# Patient Record
Sex: Male | Born: 1970 | Race: White | Hispanic: No | Marital: Married | Smoking: Never smoker
Health system: Southern US, Community
[De-identification: ages and names within clinical notes are randomized; demographics above are authoritative.]

## PROBLEM LIST (undated history)

## (undated) DIAGNOSIS — K589 Irritable bowel syndrome without diarrhea: Secondary | ICD-10-CM

## (undated) DIAGNOSIS — K5090 Crohn's disease, unspecified, without complications: Secondary | ICD-10-CM

## (undated) DIAGNOSIS — T8859XA Other complications of anesthesia, initial encounter: Secondary | ICD-10-CM

## (undated) DIAGNOSIS — Z87442 Personal history of urinary calculi: Secondary | ICD-10-CM

## (undated) DIAGNOSIS — T4145XA Adverse effect of unspecified anesthetic, initial encounter: Secondary | ICD-10-CM

## (undated) DIAGNOSIS — C8590 Non-Hodgkin lymphoma, unspecified, unspecified site: Secondary | ICD-10-CM

## (undated) DIAGNOSIS — R109 Unspecified abdominal pain: Secondary | ICD-10-CM

## (undated) HISTORY — PX: EYE SURGERY: SHX253

---
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
## 2001-02-09 HISTORY — PX: EXPLORATORY LAPAROTOMY WITH ABDOMINAL MASS EXCISION: SHX5169

## 2001-06-13 ENCOUNTER — Encounter: Admission: RE | Admit: 2001-06-13 | Discharge: 2001-06-13 | Payer: Self-pay | Admitting: Gastroenterology

## 2001-06-13 ENCOUNTER — Encounter: Payer: Self-pay | Admitting: Gastroenterology

## 2001-06-15 ENCOUNTER — Encounter: Admission: RE | Admit: 2001-06-15 | Discharge: 2001-06-15 | Payer: Self-pay | Admitting: Gastroenterology

## 2001-06-15 ENCOUNTER — Encounter: Payer: Self-pay | Admitting: Gastroenterology

## 2001-06-23 ENCOUNTER — Other Ambulatory Visit: Admission: RE | Admit: 2001-06-23 | Discharge: 2001-06-23 | Payer: Self-pay | Admitting: Gastroenterology

## 2001-06-23 ENCOUNTER — Ambulatory Visit (HOSPITAL_COMMUNITY): Admission: RE | Admit: 2001-06-23 | Discharge: 2001-06-23 | Payer: Self-pay | Admitting: Gastroenterology

## 2001-06-23 ENCOUNTER — Encounter: Payer: Self-pay | Admitting: Gastroenterology

## 2001-07-19 ENCOUNTER — Encounter: Admission: RE | Admit: 2001-07-19 | Discharge: 2001-07-19 | Payer: Self-pay | Admitting: Gastroenterology

## 2001-07-19 ENCOUNTER — Encounter: Payer: Self-pay | Admitting: Gastroenterology

## 2001-07-27 ENCOUNTER — Encounter: Admission: RE | Admit: 2001-07-27 | Discharge: 2001-07-27 | Payer: Self-pay | Admitting: Infectious Diseases

## 2001-08-25 ENCOUNTER — Encounter (INDEPENDENT_AMBULATORY_CARE_PROVIDER_SITE_OTHER): Payer: Self-pay | Admitting: *Deleted

## 2001-08-25 ENCOUNTER — Ambulatory Visit (HOSPITAL_COMMUNITY): Admission: RE | Admit: 2001-08-25 | Discharge: 2001-08-25 | Payer: Self-pay | Admitting: Gastroenterology

## 2001-09-02 ENCOUNTER — Inpatient Hospital Stay (HOSPITAL_COMMUNITY): Admission: RE | Admit: 2001-09-02 | Discharge: 2001-09-06 | Payer: Self-pay | Admitting: Surgery

## 2001-09-02 ENCOUNTER — Encounter (INDEPENDENT_AMBULATORY_CARE_PROVIDER_SITE_OTHER): Payer: Self-pay | Admitting: Specialist

## 2007-07-12 ENCOUNTER — Emergency Department (HOSPITAL_COMMUNITY): Admission: EM | Admit: 2007-07-12 | Discharge: 2007-07-13 | Payer: Self-pay | Admitting: Emergency Medicine

## 2008-04-05 ENCOUNTER — Inpatient Hospital Stay (HOSPITAL_COMMUNITY): Admission: EM | Admit: 2008-04-05 | Discharge: 2008-04-08 | Payer: Self-pay | Admitting: Emergency Medicine

## 2008-06-11 ENCOUNTER — Emergency Department (HOSPITAL_COMMUNITY): Admission: EM | Admit: 2008-06-11 | Discharge: 2008-06-11 | Payer: Self-pay | Admitting: Emergency Medicine

## 2008-07-10 ENCOUNTER — Encounter: Admission: RE | Admit: 2008-07-10 | Discharge: 2008-07-10 | Payer: Self-pay | Admitting: Gastroenterology

## 2009-01-20 ENCOUNTER — Emergency Department (HOSPITAL_COMMUNITY): Admission: EM | Admit: 2009-01-20 | Discharge: 2009-01-20 | Payer: Self-pay | Admitting: Emergency Medicine

## 2010-02-24 IMAGING — CR DG ABDOMEN 2V
3 series · 3 of 3 positions shown · non-contrast
Comparison: 04/07/2008

CLINICAL DATA: Abdominal pain, nausea, vomiting, hematemesis, past
history non-Hodgkins lymphoma and exploratory laparotomy

ABDOMEN - 2 VIEW

[w abdomen upright]
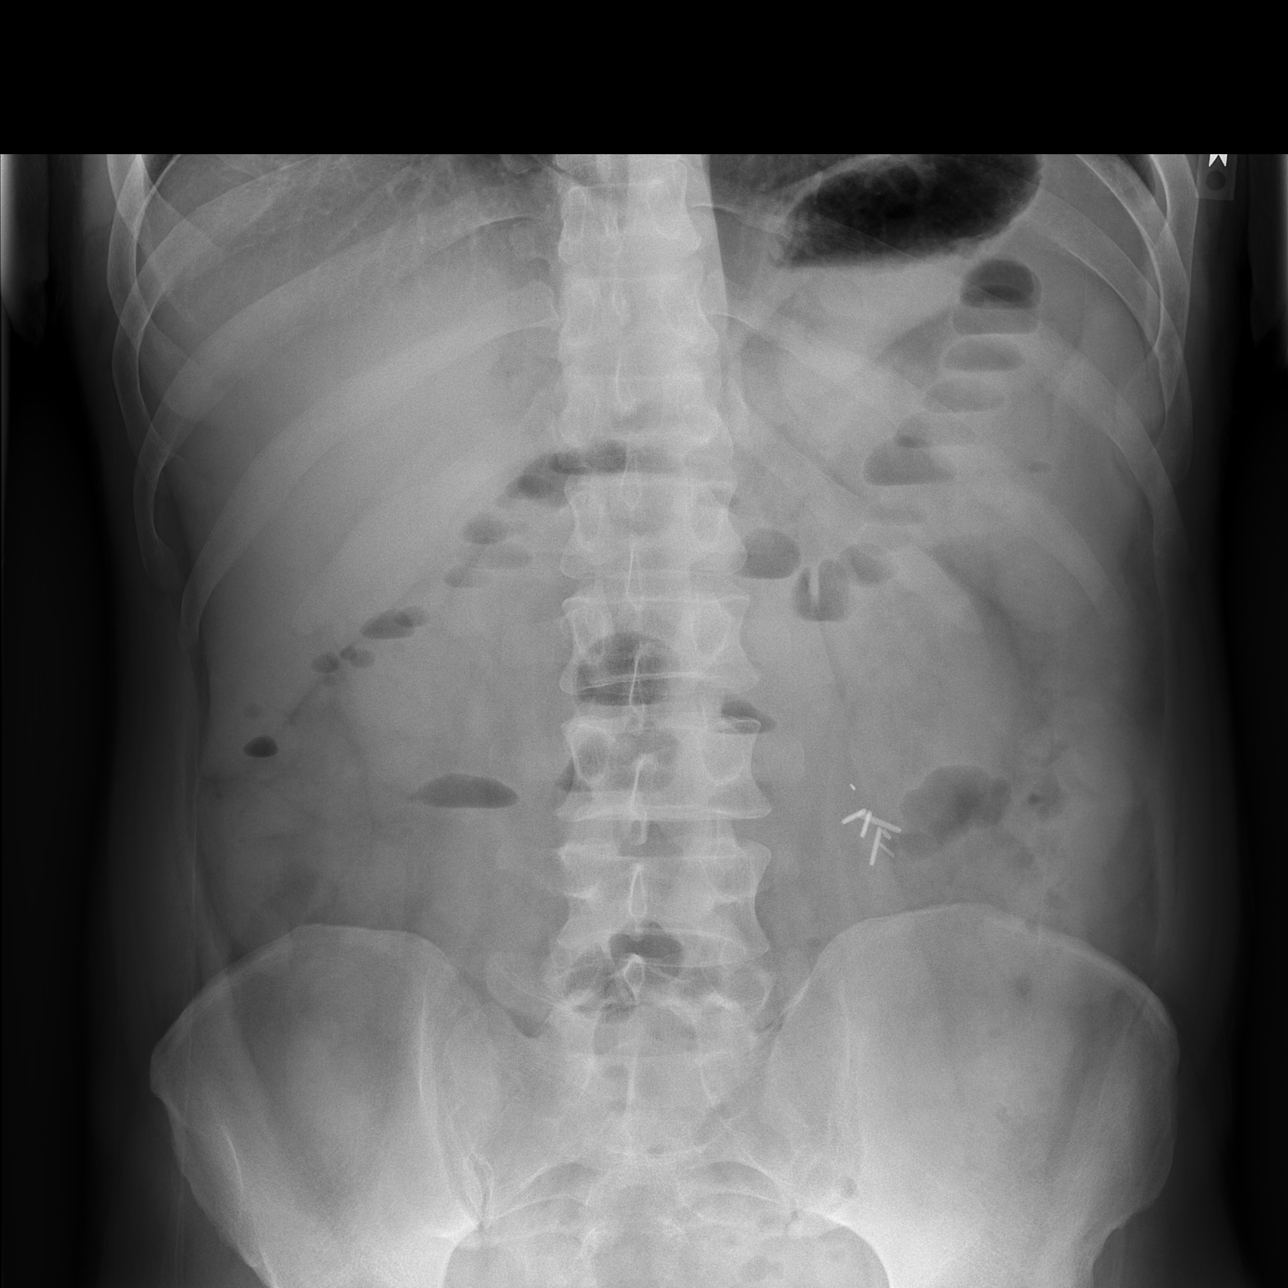

[t abdomen supine (1 of 2)]
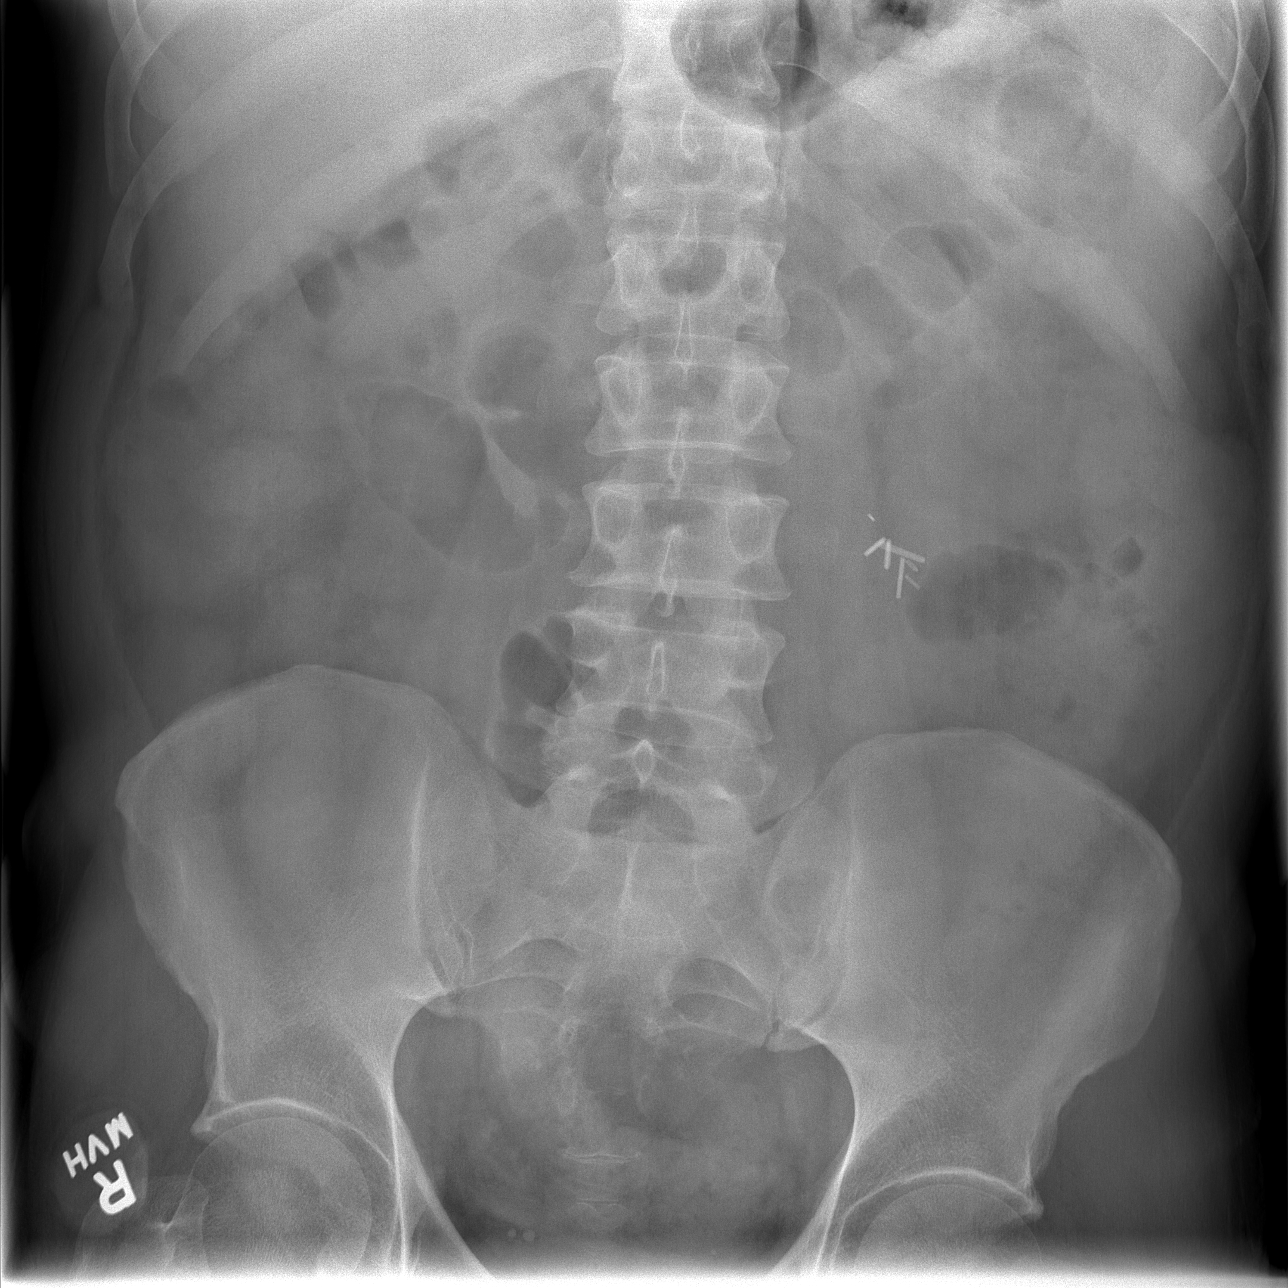

[t abdomen supine (2 of 2)]
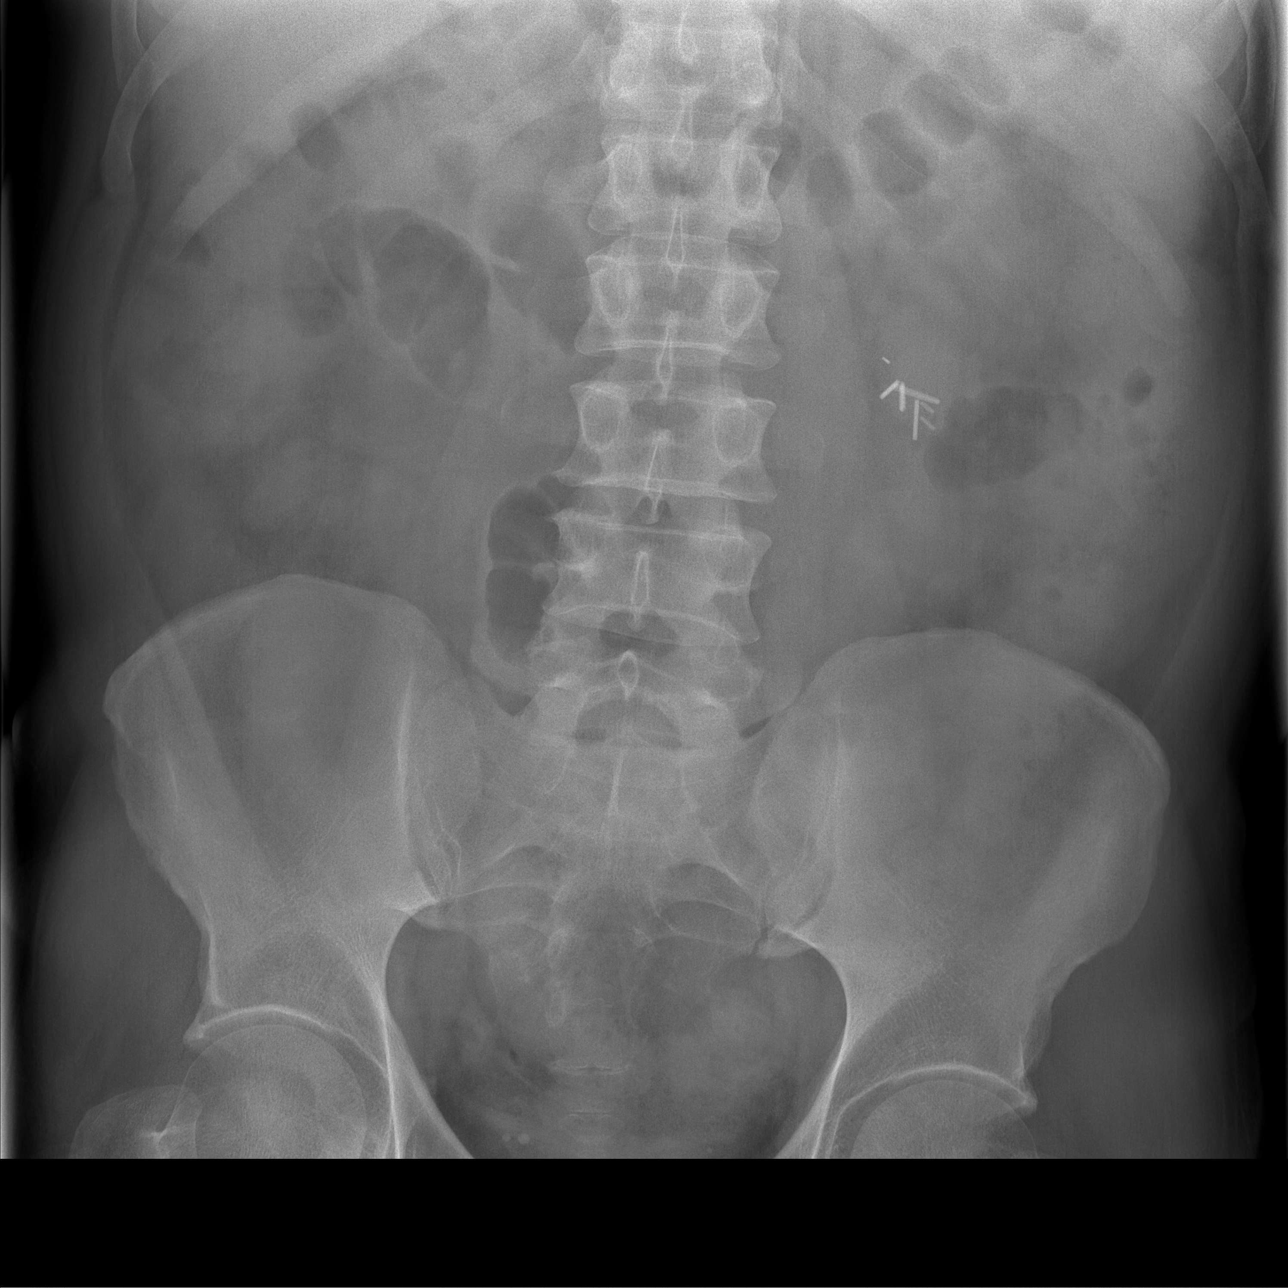

[3 of 3 positions shown; findings below may reference images not displayed]

FINDINGS: Surgical clips left mid abdomen.
Bones unremarkable.
Small right pelvic phleboliths.
No urinary tract calcification.
Small amount gas in transverse colon.
Few small bowel loops in mid abdomen.
Bowel gas pattern is nonspecific without definite evidence of
obstruction or wall thickening.
No free intraperitoneal air.
IMPRESSION: Nonspecific bowel gas pattern.

## 2010-05-13 LAB — COMPREHENSIVE METABOLIC PANEL
Alkaline Phosphatase: 80 U/L (ref 39–117)
BUN: 10 mg/dL (ref 6–23)
Chloride: 103 mEq/L (ref 96–112)
Glucose, Bld: 100 mg/dL — ABNORMAL HIGH (ref 70–99)
Potassium: 4.1 mEq/L (ref 3.5–5.1)
Total Bilirubin: 1 mg/dL (ref 0.3–1.2)
Total Protein: 6.9 g/dL (ref 6.0–8.3)

## 2010-05-13 LAB — DIFFERENTIAL
Basophils Absolute: 0 10*3/uL (ref 0.0–0.1)
Basophils Relative: 0 % (ref 0–1)
Neutro Abs: 10 10*3/uL — ABNORMAL HIGH (ref 1.7–7.7)
Neutrophils Relative %: 90 % — ABNORMAL HIGH (ref 43–77)

## 2010-05-13 LAB — CBC
HCT: 42.6 % (ref 39.0–52.0)
Hemoglobin: 14 g/dL (ref 13.0–17.0)
MCV: 85 fL (ref 78.0–100.0)
RDW: 14 % (ref 11.5–15.5)

## 2010-05-20 LAB — COMPREHENSIVE METABOLIC PANEL
ALT: 14 U/L (ref 0–53)
AST: 22 U/L (ref 0–37)
CO2: 29 mEq/L (ref 19–32)
Chloride: 105 mEq/L (ref 96–112)
GFR calc Af Amer: >60 mL/min (ref >60)
GFR calc non Af Amer: >60 mL/min (ref >60)
Sodium: 140 mEq/L (ref 135–145)
Total Bilirubin: 0.8 mg/dL (ref 0.3–1.2)

## 2010-05-20 LAB — LIPASE, BLOOD: Lipase: 28 U/L (ref 11–59)

## 2010-05-20 LAB — DIFFERENTIAL
Basophils Absolute: 0 10*3/uL (ref 0.0–0.1)
Eosinophils Absolute: 0.2 10*3/uL (ref 0.0–0.7)
Eosinophils Relative: 2 % (ref 0–5)
Lymphs Abs: 0.9 10*3/uL (ref 0.7–4.0)

## 2010-05-20 LAB — CBC
RBC: 5.28 MIL/uL (ref 4.22–5.81)
WBC: 8.4 10*3/uL (ref 4.0–10.5)

## 2010-05-27 LAB — COMPREHENSIVE METABOLIC PANEL
Albumin: 4.3 g/dL (ref 3.5–5.2)
Alkaline Phosphatase: 92 U/L (ref 39–117)
BUN: 9 mg/dL (ref 6–23)
Calcium: 9.7 mg/dL (ref 8.4–10.5)
Creatinine, Ser: 1.19 mg/dL (ref 0.4–1.5)
Potassium: 4 mEq/L (ref 3.5–5.1)
Total Protein: 7.5 g/dL (ref 6.0–8.3)

## 2010-05-27 LAB — DIFFERENTIAL
Basophils Absolute: 0 10*3/uL (ref 0.0–0.1)
Basophils Relative: 0 % (ref 0–1)
Lymphocytes Relative: 13 % (ref 12–46)
Lymphocytes Relative: 8 % — ABNORMAL LOW (ref 12–46)
Lymphs Abs: 1.1 10*3/uL (ref 0.7–4.0)
Monocytes Absolute: 0.4 10*3/uL (ref 0.1–1.0)
Monocytes Absolute: 0.5 10*3/uL (ref 0.1–1.0)
Monocytes Relative: 10 % (ref 3–12)
Monocytes Relative: 3 % (ref 3–12)
Neutro Abs: 12.4 10*3/uL — ABNORMAL HIGH (ref 1.7–7.7)
Neutro Abs: 3.7 10*3/uL (ref 1.7–7.7)
Neutrophils Relative %: 75 % (ref 43–77)

## 2010-05-27 LAB — CBC
HCT: 50.7 % (ref 39.0–52.0)
Hemoglobin: 12.3 g/dL — ABNORMAL LOW (ref 13.0–17.0)
Platelets: 190 10*3/uL (ref 150–400)
RBC: 4.35 MIL/uL (ref 4.22–5.81)
RDW: 13.7 % (ref 11.5–15.5)
RDW: 14.9 % (ref 11.5–15.5)

## 2010-05-27 LAB — BASIC METABOLIC PANEL
Calcium: 8.5 mg/dL (ref 8.4–10.5)
Creatinine, Ser: 1.17 mg/dL (ref 0.4–1.5)
GFR calc Af Amer: >60 mL/min (ref >60)
GFR calc non Af Amer: >60 mL/min (ref >60)
Sodium: 139 mEq/L (ref 135–145)

## 2010-05-27 LAB — URINALYSIS, ROUTINE W REFLEX MICROSCOPIC
Glucose, UA: NEGATIVE mg/dL
Hgb urine dipstick: NEGATIVE
Specific Gravity, Urine: 1.029 (ref 1.005–1.030)
pH: 8 (ref 5.0–8.0)

## 2010-05-27 LAB — URINE MICROSCOPIC-ADD ON

## 2010-06-24 NOTE — H&P (Signed)
NAMENISHAN, OVENS                 ACCOUNT NO.:  [id number]   MEDICAL RECORD NO.:  [id number]          PATIENT TYPE:  INP   LOCATION:  [place]                         FACILITY:  [facility]   PHYSICIAN:  Isabel Caprice. Hassell Done, MD  DATE OF BIRTH:  12-14-1970   DATE OF ADMISSION:  04/05/2008  DATE OF DISCHARGE:                              HISTORY & PHYSICAL   CHIEF COMPLAINT:  Partial small bowel obstruction.   HISTORY:  This is a 40 year old white male who began having cramping  abdominal pain after eating chicken tenders on April 04, 2008.  He  was seen in the emergency department, evaluated and CT scan was obtained  which showed some dilated proximal loops of small bowel with transition  zone thought possibly partial small bowel obstruction.  His history  significant.  He had had a non-Hodgkin's lymphoma on exploratory  laparotomy by Dr. Margot Chimes in the past.  He has also seen Dr. Mikki Santee Buccini  in the past for intermittent abdominal discomfort, possibly partial  small bowel obstruction.  He previously had an attack a few weeks ago  for which he was seen in emergency room.  This time the pain lasted  longer.   Current problem has been abdominal pain associated with nausea, vomiting  and some distention and he has had some feeling of borborygmus.   ALLERGIES:  PENICILLIN WHICH CAUSES HIVES AND LATEX WHICH PRODUCES A  RASH.   MEDICATIONS:  Include oral Tylenol for sinus allergies and Pepto-Bismol.   SOCIAL HISTORY:  He is a nonsmoker, nondrinker.  No drug abuse.  He is  married.   PHYSICAL:  Well-developed, well-nourished white male with an NG tube in  place.  HEAD:  Normocephalic.  Sclerae nonicteric.  Pupils equal, round and  reactive to light.  Nose and throat exam unremarkable.  NECK:  Supple.  CHEST:  Clear.  ABDOMEN:  Has some tenderness and some distention.  No flatus or BMs.  EXTREMITY EXAM:  Full range of motion.  SKIN:  No rashes noted.  PSYCH:  Appropriate in terms of  questions and mood.   X-RAYS:  Small air-fluid levels.   LABORATORY:  Includes a hemoglobin of 16.8.  White count 14,100.  Normal  electrolytes including a normal potassium of 4.   IMPRESSION:  1. Partial small bowel obstruction.  2. Questionable circumferential mural thickening in the terminal      ileum, suggesting possible inflammatory bowel disease versus      terminal ileitis.   PLAN:  Admit observation with nasogastric tube suction.      Isabel Caprice Hassell Done, MD  Electronically Signed     MBM/MEDQ  D:  04/05/2008  T:  04/05/2008  Job:  [id number]

## 2010-06-27 NOTE — Discharge Summary (Signed)
NAMETARAY, NORMOYLE                 ACCOUNT NO.:  [id number]   MEDICAL RECORD NO.:  [id number]          PATIENT TYPE:  INP   LOCATION:  1342                         FACILITY:  [facility]   PHYSICIAN:  Isabel Caprice. Hassell Done, MD  DATE OF BIRTH:  02-Mar-1970   DATE OF ADMISSION:  04/04/2008  DATE OF DISCHARGE:  04/08/2008                               DISCHARGE SUMMARY   HISTORY:  Eric Winters presented to the ED with symptoms of a partial  small-bowel obstruction.  He has had a history of non-Hodgkins lymphoma  treated with laparotomy by Dr. Margot Chimes.  He came in and had an NG tube  placed and then began passing some flatus.  We watched his x-rays get  better, and we then started him on some liquids.  He advanced over that  and was ready to go home on hospital day #4 with what appeared to be  resolved partial small-bowel obstruction, and he was scheduled to follow  up with Dr. Hassell Done in the office.  Vital signs remained stable.   IMPRESSION:  Resolution of partial small-bowel obstruction.      Isabel Caprice Hassell Done, MD  Electronically Signed     MBM/MEDQ  D:  05/21/2008  T:  05/21/2008  Job:  [id number]

## 2010-06-27 NOTE — Discharge Summary (Signed)
   NAME:  Eric Winters, STETTNER                       ACCOUNT NO.:  [id number]   MEDICAL RECORD NO.:  [id number]                   PATIENT TYPE:  INP   LOCATION:  6294                                 FACILITY:  [facility]   PHYSICIAN:  Haywood Lasso, M.D.           DATE OF BIRTH:  Feb 18, 1970   DATE OF ADMISSION:  09/02/2001  DATE OF DISCHARGE:  09/04/2001                                 DISCHARGE SUMMARY   OFFICE MEDICAL RECORD NUMBER  [id number]   FINAL DIAGNOSIS:  1. Low-grade follicular non-Hodgkin's lymphoma (mixed), grade 2.  2. Probable mild Crohn's disease of the terminal ileum.   HISTORY OF PRESENT ILLNESS:  The patient is a 40 year old gentleman who has  been evaluated for some abdominal symptoms, was found to possibly have some  Crohn's but also marked adenopathy in the abdomen.  There was no more  superficial adenopathy that could be biopsied, and a percutaneous biopsy had  been nondiagnostic.   HOSPITAL COURSE:  The patient was admitted on September 02, 2001, and taken to  the operating room where a laparotomy was done.  Markedly enlarged  mesenteric nodes were noted, as well as what appeared to be possibly some  abnormality of the omentum.  A small omental biopsy was taken as well as a  fairly large piece of a mesenteric node.   The patient tolerated the procedure fairly well.  Postoperatively he had a  fair amount of pain, but this was managed with Toradol plus some morphine.  He improved over the next couple of days and, by the fourth postoperative  day, was passing gas, no nausea, tolerating diet, and we felt was able to be  discharged.  He was sent home in satisfactory condition.  Tylox for pain.  Follow-up in my office in approximately five days, and limited activities.                                               Haywood Lasso, M.D.    CJS/MEDQ  D:  09/15/2001  T:  09/17/2001  Job:  [id number]   cc:   Cleotis Nipper, M.D.

## 2010-06-27 NOTE — Procedures (Signed)
[place]  Patient:    Eric Winters, Eric Winters Visit Number: [id number] MRN: [id number]          Service Type: END Location: ENDO Attending Physician:  Ernie Avena Dictated by:   Cleotis Nipper, M.D. Proc. Date: 08/25/01 Admit Date:  08/25/2001 Discharge Date: 08/25/2001   CC:         Dr. Elliot Gurney   Procedure Report  COLONOSCOPY WITH BIOPSIES  INDICATION:  This is a 40 year old gentleman who, for the past several months, has had nonspecific abdominal pain (actually getting better at the present time) with a CT scan showing mesenteric and retroperitoneal lymphadenopathy suspicious for lymphoma.  On the other hand, a fine-needle biopsy of that material was equivocal for lymphoma and showed some characteristics which would not be typical for lymphoma in this age group.  Accordingly, there has been quite a bit of diagnostic uncertainty.  The patient is scheduled for exploratory surgery and definitive biopsy about two weeks from now but before doing so, I wanted to rule out Crohns ileitis since there was a question of some terminal ileal thickening on the patients CT scan.  FINDINGS:  Essentially normal exam/slight erythema and stiffness of the terminal ileum without any typical findings of Crohns.  PROCEDURE:  The nature and purpose of the procedure have been discussed with the patient and provided with consent.  Sedation was fentanyl 100 mcg and Versed 10 mg IV without arrhythmias or desaturation.  Digital exam of the prostate was unremarkable.  The Olympus videocolonoscope was advanced without much difficulty to the cecum and then for a short distance into the terminal ileum.  The ileocecal valve orifice was slightly tight, and upon entering the terminal ileum, it felt a little bit "stiff" but this is an equivocal call. There was some erythema of the mucosa and some slight nodularity consistent with lymphoid hyperplasia but I did  not see any impressive nodules or mass effect, nor did I see any ulcerations, cobblestoning, or obvious changes of Crohns disease.  Several biopsies were obtained prior to removal of the scope.  The colon was normal, without evidence of polyps, cancer, colitis, vascular malformations, or diverticulosis.  The quality of the prep was very good and it was felt that all areas were well seen.  Retroflexion was not performed in the rectum due to a small rectal ampulla.  The patient tolerated the procedure well and there were no apparent complications.  IMPRESSION:  Slight abnormalities in the terminal ileum, nonspecific in character and not really suggestive of Crohns disease, but neither are they overtly suspicious of lymphoma.  PLAN: 1. Await pathology on todays biopsies. 2. For now, we will anticipate proceeding with exploratory surgery as    scheduled for approximately one week from now.Dictated by:   Cleotis Nipper, M.D. Attending Physician:  Ernie Avena DD:  08/25/01 TD:  08/30/01 Job: [id number]

## 2010-06-27 NOTE — Op Note (Signed)
[facility]  Patient:    Eric Winters, Eric Winters Visit Number: [id number] MRN: [id number]          Service Type: SUR Location: 3W 6010 01 Attending Physician:  Oletha Cruel Dictated by:   Haywood Lasso, M.D. Proc. Date: 09/02/01 Admit Date:  09/02/2001 Discharge Date: 09/02/2001   CC:         Cleotis Nipper, M.D.   Operative Report  ACCOUNT NO. [id number]. CCS [id number].  PREOPERATIVE DIAGNOSES: 1. Possible abdominal lymphoma. 2. Possible Crohns.  POSTOPERATIVE DIAGNOSES: 1. Abdominal adenopathy, probable lymphoma. 2. Probable Crohns.  PROCEDURE:  Exploratory laparotomy with excisional biopsy of mesenteric lymph node and omental biopsy.  SURGEON:  Haywood Lasso, M.D.  ASSISTANT:  Abbott Pao. March Rummage, M.D.  ANESTHESIA:  General endotracheal.  CLINICAL HISTORY:  Mr. Tubby is a 40 year old gentleman who has had an evaluation showing some significant adenopathy in his abdomen, and a radiographic biopsy showed atypical lymphoid cells suspicious but not diagnostic of lymphoma.  He also had an area in the small bowel that looked like Crohns disease and other workups to see if there was any infectious cause for his lymphadenopathy.  None was found, and he was recommended for abdominal exploration and biopsy of some of these retroperitoneal nodes.  DESCRIPTION OF PROCEDURE:  The patient was seen in the holding area, and all questions were reviewed again with him and answered.  He was then taken to the operating room and after satisfactory general endotracheal anesthesia, the abdomen was prepped and draped.  I made a midline incision from just above the umbilicus to below it and I was able to enter the abdomen, but he is fairly muscular and this was an inadequate-length incision.  I could feel some significant retroperitoneal adenopathy.  Incision was extended to an appropriate length and a self-retaining retractor placed.  The liver  and spleen to palpation were normal.  The small bowel looked normal until the terminal ileum, where it looked somewhat thickened and there was creeping fat suggestive of Crohns disease.  There was no evidence of active inflammatory process there and the distal mesentery throughout the small bowel was fairly normal, but the proximal mesentery was diffusely filled with enlarged nodes, most of which were matted.  I was able to pull up a little bit and find a very hard node in the proximal small bowel mesentery that I thought we could get out fairly readily.  I opened the peritoneum just over this node and using a combination of sharp and blunt dissection and some small clips, I was able to excise this.  It was felt fairly necrotic, and the mesenteric blood vessels came essentially directly through this node and I had to clip those.  The node was removed and a small portion sent for cultures and the larger piece for pathology.  I felt I had adequate tissue as I had about a 3 cm section of node.  The area was checked for hemostasis.  I left a little Surgicel in in case there was any oozing and closed the mesentery over the biopsy site.  The omentum on the inferior surface was studded with small red areas that were reminiscent of splenosis, although possibly some lymphomatous change here also.  I took a couple of the smaller areas out and then also a little wedge of omentum that contained one of those areas plus some normal omentum and sent that for pathology.  The omentum was tied with 2-0 silk where  we took these off.  Everything appeared to be dry.  There was no collection of blood, no evidence of bleeding at the biopsy site of the lymph node or any of the omentum.  The abdomen was closed with a running #1 Novofil.  The subcu was irrigated and closed with staples.  The patient tolerated the procedure well.  There were no operative complications, and all counts were correct. Dictated by:    Haywood Lasso, M.D. Attending Physician:  Oletha Cruel DD:  09/02/01 TD:  09/06/01 Job: [id number]

## 2010-07-22 ENCOUNTER — Other Ambulatory Visit: Payer: Self-pay | Admitting: Gastroenterology

## 2010-07-25 ENCOUNTER — Ambulatory Visit
Admission: RE | Admit: 2010-07-25 | Discharge: 2010-07-25 | Disposition: A | Discharge status: Home / Self Care | Payer: BC Managed Care – PPO | Source: Ambulatory Visit | Attending: Gastroenterology | Admitting: Gastroenterology

## 2010-08-23 ENCOUNTER — Emergency Department (HOSPITAL_COMMUNITY)
Admission: EM | Admit: 2010-08-23 | Discharge: 2010-08-23 | Disposition: A | Discharge status: Home / Self Care | Payer: BC Managed Care – PPO | Attending: Emergency Medicine | Admitting: Emergency Medicine

## 2010-08-23 ENCOUNTER — Emergency Department (HOSPITAL_COMMUNITY): Payer: BC Managed Care – PPO

## 2010-08-23 DIAGNOSIS — R1012 Left upper quadrant pain: Secondary | ICD-10-CM | POA: Insufficient documentation

## 2010-08-23 DIAGNOSIS — R112 Nausea with vomiting, unspecified: Secondary | ICD-10-CM | POA: Insufficient documentation

## 2010-08-23 DIAGNOSIS — R319 Hematuria, unspecified: Secondary | ICD-10-CM | POA: Insufficient documentation

## 2010-08-23 DIAGNOSIS — C8589 Other specified types of non-Hodgkin lymphoma, extranodal and solid organ sites: Secondary | ICD-10-CM | POA: Insufficient documentation

## 2010-08-23 DIAGNOSIS — R109 Unspecified abdominal pain: Secondary | ICD-10-CM | POA: Insufficient documentation

## 2010-08-23 LAB — CBC
Hemoglobin: 14.3 g/dL (ref 13.0–17.0)
MCH: 27 pg (ref 26.0–34.0)
MCHC: 32.3 g/dL (ref 30.0–36.0)
MCV: 83.7 fL (ref 78.0–100.0)

## 2010-08-23 LAB — URINALYSIS, ROUTINE W REFLEX MICROSCOPIC
Bilirubin Urine: NEGATIVE
Glucose, UA: NEGATIVE mg/dL
Hgb urine dipstick: NEGATIVE
Protein, ur: 100 mg/dL — AB

## 2010-08-23 LAB — COMPREHENSIVE METABOLIC PANEL
Alkaline Phosphatase: 102 U/L (ref 39–117)
BUN: 11 mg/dL (ref 6–23)
CO2: 25 mEq/L (ref 19–32)
Chloride: 101 mEq/L (ref 96–112)
Creatinine, Ser: 1.11 mg/dL (ref 0.50–1.35)
GFR calc non Af Amer: >60 mL/min (ref >60)
Glucose, Bld: 106 mg/dL — ABNORMAL HIGH (ref 70–99)
Potassium: 3.6 mEq/L (ref 3.5–5.1)
Total Bilirubin: 0.6 mg/dL (ref 0.3–1.2)

## 2010-08-23 LAB — DIFFERENTIAL
Basophils Absolute: 0 10*3/uL (ref 0.0–0.1)
Lymphocytes Relative: 6 % — ABNORMAL LOW (ref 12–46)
Monocytes Absolute: 0.7 10*3/uL (ref 0.1–1.0)
Neutro Abs: 9.1 10*3/uL — ABNORMAL HIGH (ref 1.7–7.7)

## 2010-08-23 LAB — LIPASE, BLOOD: Lipase: 34 U/L (ref 11–59)

## 2010-11-06 LAB — DIFFERENTIAL
Basophils Absolute: 0
Eosinophils Relative: 0
Lymphocytes Relative: 6 — ABNORMAL LOW
Monocytes Absolute: 0.4
Monocytes Relative: 4
Neutro Abs: 9.1 — ABNORMAL HIGH

## 2010-11-06 LAB — URINALYSIS, ROUTINE W REFLEX MICROSCOPIC
Bilirubin Urine: NEGATIVE
Glucose, UA: NEGATIVE
Hgb urine dipstick: NEGATIVE
Specific Gravity, Urine: 1.005
Urobilinogen, UA: 0.2

## 2010-11-06 LAB — CBC
Platelets: 183
RDW: 14.8
WBC: 10.1

## 2010-11-06 LAB — COMPREHENSIVE METABOLIC PANEL
AST: 24
Albumin: 3.7
Alkaline Phosphatase: 91
Chloride: 103
Creatinine, Ser: 1.23
GFR calc Af Amer: >60
Potassium: 4.1
Total Bilirubin: 1
Total Protein: 6.7

## 2011-08-15 ENCOUNTER — Encounter (HOSPITAL_BASED_OUTPATIENT_CLINIC_OR_DEPARTMENT_OTHER): Payer: Self-pay | Admitting: *Deleted

## 2011-08-15 ENCOUNTER — Emergency Department (HOSPITAL_BASED_OUTPATIENT_CLINIC_OR_DEPARTMENT_OTHER)
Admission: EM | Admit: 2011-08-15 | Discharge: 2011-08-15 | Disposition: A | Discharge status: Home / Self Care | Payer: BC Managed Care – PPO | Attending: Emergency Medicine | Admitting: Emergency Medicine

## 2011-08-15 DIAGNOSIS — M109 Gout, unspecified: Secondary | ICD-10-CM

## 2011-08-15 DIAGNOSIS — K5090 Crohn's disease, unspecified, without complications: Secondary | ICD-10-CM | POA: Insufficient documentation

## 2011-08-15 DIAGNOSIS — C8589 Other specified types of non-Hodgkin lymphoma, extranodal and solid organ sites: Secondary | ICD-10-CM | POA: Insufficient documentation

## 2011-08-15 HISTORY — DX: Irritable bowel syndrome without diarrhea: K58.9

## 2011-08-15 HISTORY — DX: Crohn's disease, unspecified, without complications: K50.90

## 2011-08-15 HISTORY — DX: Non-Hodgkin lymphoma, unspecified, unspecified site: C85.90

## 2011-08-15 HISTORY — DX: Irritable bowel syndrome, unspecified: K58.9

## 2011-08-15 MED ORDER — INDOMETHACIN 25 MG PO CAPS: 25.0000 mg | ORAL_CAPSULE | Freq: Three times a day (TID) | ORAL | Status: AC | PRN

## 2011-08-15 MED ORDER — OXYCODONE-ACETAMINOPHEN 5-325 MG PO TABS
1.0000 | ORAL_TABLET | Freq: Once | ORAL | Status: AC
  Administered 2011-08-15: 1 via ORAL
  Filled 2011-08-15: qty 1

## 2011-08-15 MED ORDER — OXYCODONE-ACETAMINOPHEN 5-325 MG PO TABS: 1.0000 | ORAL_TABLET | Freq: Four times a day (QID) | ORAL | Status: AC | PRN

## 2011-08-15 MED ORDER — COLCHICINE 0.6 MG PO TABS: 0.6000 mg | ORAL_TABLET | Freq: Every day | ORAL | Status: DC

## 2011-08-15 NOTE — ED Provider Notes (Signed)
History   This chart was scribed for [name], MD by Kathreen Cornfield. The patient was seen in room MH06/MH06 and the patient's care was started at 10:35 PM      CSN: [id number]  Arrival date & time 08/15/11  2152   First MD Initiated Contact with Patient 08/15/11 2207      Chief Complaint  Patient presents with  . Foot Pain    (Consider location/radiation/quality/duration/timing/severity/associated sxs/prior treatment) Patient is a 41 y.o. male presenting with lower extremity pain. The history is provided by the patient. No language interpreter was used.  Foot Pain This is a new problem. The current episode started 6 to 12 hours ago. The problem occurs constantly. The problem has not changed since onset.Pertinent negatives include no chest pain, no abdominal pain and no headaches. The symptoms are aggravated by walking and stress. Nothing relieves the symptoms. He has tried nothing for the symptoms. The treatment provided no relief.    Eric Winters is a 41 y.o. male who presents to the Emergency Department complaining of moderate, episodic foot pain located at the right foot onset today with associated symptoms of swelling. The pt informs the EDP that he went to put his shoe on today, prepairing to do yard work, where he then noticed his foot was swollen. Pt reports he has recently eaten hamburgers, and hot dogs at a cookout. The pt informs the EDP that he has been sitting at a desk more than in the past, for the past month. Pt has a hx of crohns disease, non hodgkin's lymphoma, allergy to penicillin.   Pt denies drinking alcohol, changes in medications.       Past Medical History  Diagnosis Date  . Non Hodgkin's lymphoma   . IBS (irritable bowel syndrome)   . Crohn disease     Past Surgical History  Procedure Date  . Eye surgery   . Abdominal surgery     History reviewed. No pertinent family history.  History  Substance Use Topics  . Smoking status: Never Smoker     . Smokeless tobacco: Not on file  . Alcohol Use: Yes      Review of Systems  Cardiovascular: Negative for chest pain.  Gastrointestinal: Negative for abdominal pain.  Neurological: Negative for headaches.  All other systems reviewed and are negative.    10 Systems reviewed and all are negative for acute change except as noted in the HPI.    Allergies  Review of patient's allergies indicates not on file.  Home Medications  No current outpatient prescriptions on file.  BP 123/77  Pulse 82  Temp 98.4 F (36.9 C) (Oral)  Resp 20  Ht 5' 10.5" (1.791 m)  Wt 220 lb (99.791 kg)  BMI 31.12 kg/m2  SpO2 96%  Physical Exam  Nursing note and vitals reviewed. Constitutional: He is oriented to person, place, and time. He appears well-developed and well-nourished.  HENT:  Head: Atraumatic.  Right Ear: External ear normal.  Left Ear: External ear normal.  Nose: Nose normal.  Neck: Normal range of motion.  Musculoskeletal:       Right first MTP warmth, redness, and tenderness.   Neurological: He is alert and oriented to person, place, and time.  Skin: Skin is warm and dry.  Psychiatric: He has a normal mood and affect. His behavior is normal.    ED Course  Procedures (including critical care time)  DIAGNOSTIC STUDIES: Oxygen Saturation is 96% on room air, adequate by my interpretation.  COORDINATION OF CARE:    10:36PM- EDP at bedside discusses treatment plan concerning gout, Cotranzine, pain management, follow up with PCP.   Labs Reviewed - No data to display No results found.   1. Gout       MDM   Patient with evidence of gout in his right great toe. There's no sign of cellulitis or concern for septic joint at this time. Patient was given supportive care and will follow up with his PCP      I personally performed the services described in this documentation, which was scribed in my presence.  The recorded information has been reviewed and  considered.   [name], MD 08/15/11 2246

## 2011-08-15 NOTE — ED Notes (Signed)
rx x 3 given for percocet, indocin, and colchicine- pt has a ride

## 2011-08-15 NOTE — ED Notes (Signed)
Pt states that his right foot has been red and swollen since this a.m. Thinks he may have been bitten by something.

## 2012-02-18 ENCOUNTER — Encounter (HOSPITAL_BASED_OUTPATIENT_CLINIC_OR_DEPARTMENT_OTHER): Payer: Self-pay | Admitting: *Deleted

## 2012-02-18 DIAGNOSIS — Z79899 Other long term (current) drug therapy: Secondary | ICD-10-CM | POA: Insufficient documentation

## 2012-02-18 DIAGNOSIS — R109 Unspecified abdominal pain: Secondary | ICD-10-CM | POA: Insufficient documentation

## 2012-02-18 DIAGNOSIS — C8589 Other specified types of non-Hodgkin lymphoma, extranodal and solid organ sites: Secondary | ICD-10-CM | POA: Insufficient documentation

## 2012-02-18 DIAGNOSIS — Z8719 Personal history of other diseases of the digestive system: Secondary | ICD-10-CM | POA: Insufficient documentation

## 2012-02-18 NOTE — ED Notes (Signed)
Pt c/o abd pain with n/v x 8 hrs

## 2012-02-19 ENCOUNTER — Emergency Department (HOSPITAL_COMMUNITY)
Admission: EM | Admit: 2012-02-19 | Discharge: 2012-02-19 | Disposition: A | Discharge status: Home / Self Care | Payer: BC Managed Care – PPO | Source: Home / Self Care

## 2012-02-19 ENCOUNTER — Inpatient Hospital Stay (HOSPITAL_COMMUNITY)
Admission: EM | Admit: 2012-02-19 | Discharge: 2012-02-22 | DRG: 180 | Disposition: A | Discharge status: Home / Self Care | Payer: BC Managed Care – PPO | Attending: General Surgery | Admitting: General Surgery

## 2012-02-19 ENCOUNTER — Emergency Department (HOSPITAL_COMMUNITY): Payer: BC Managed Care – PPO

## 2012-02-19 ENCOUNTER — Emergency Department (HOSPITAL_BASED_OUTPATIENT_CLINIC_OR_DEPARTMENT_OTHER): Payer: BC Managed Care – PPO

## 2012-02-19 ENCOUNTER — Encounter (HOSPITAL_COMMUNITY): Payer: Self-pay | Admitting: Emergency Medicine

## 2012-02-19 ENCOUNTER — Emergency Department (HOSPITAL_BASED_OUTPATIENT_CLINIC_OR_DEPARTMENT_OTHER)
Admission: EM | Admit: 2012-02-19 | Discharge: 2012-02-19 | Disposition: A | Discharge status: Home / Self Care | Payer: BC Managed Care – PPO | Source: Home / Self Care | Attending: Emergency Medicine | Admitting: Emergency Medicine

## 2012-02-19 DIAGNOSIS — K56609 Unspecified intestinal obstruction, unspecified as to partial versus complete obstruction: Principal | ICD-10-CM | POA: Diagnosis present

## 2012-02-19 DIAGNOSIS — C8583 Other specified types of non-Hodgkin lymphoma, intra-abdominal lymph nodes: Secondary | ICD-10-CM | POA: Diagnosis present

## 2012-02-19 DIAGNOSIS — Z79899 Other long term (current) drug therapy: Secondary | ICD-10-CM

## 2012-02-19 DIAGNOSIS — R109 Unspecified abdominal pain: Secondary | ICD-10-CM

## 2012-02-19 DIAGNOSIS — Z8719 Personal history of other diseases of the digestive system: Secondary | ICD-10-CM

## 2012-02-19 DIAGNOSIS — C8593 Non-Hodgkin lymphoma, unspecified, intra-abdominal lymph nodes: Secondary | ICD-10-CM | POA: Diagnosis present

## 2012-02-19 DIAGNOSIS — Z91040 Latex allergy status: Secondary | ICD-10-CM

## 2012-02-19 DIAGNOSIS — Z880 Allergy status to penicillin: Secondary | ICD-10-CM

## 2012-02-19 LAB — COMPREHENSIVE METABOLIC PANEL
ALT: 12 U/L (ref 0–53)
BUN: 12 mg/dL (ref 6–23)
CO2: 21 mEq/L (ref 19–32)
Calcium: 10 mg/dL (ref 8.4–10.5)
GFR calc Af Amer: >90 mL/min (ref >90)
GFR calc non Af Amer: >90 mL/min (ref >90)
Glucose, Bld: 118 mg/dL — ABNORMAL HIGH (ref 70–99)
Sodium: 138 mEq/L (ref 135–145)
Total Protein: 7.4 g/dL (ref 6.0–8.3)

## 2012-02-19 LAB — URINALYSIS, ROUTINE W REFLEX MICROSCOPIC
Bilirubin Urine: NEGATIVE
Ketones, ur: >80 mg/dL — AB
Leukocytes, UA: NEGATIVE
Nitrite: NEGATIVE
Specific Gravity, Urine: 1.028 (ref 1.005–1.030)
Urobilinogen, UA: 0.2 mg/dL (ref 0.0–1.0)

## 2012-02-19 LAB — CBC WITH DIFFERENTIAL/PLATELET
Eosinophils Absolute: 0 10*3/uL (ref 0.0–0.7)
Eosinophils Relative: 0 % (ref 0–5)
HCT: 47.3 % (ref 39.0–52.0)
Hemoglobin: 15.5 g/dL (ref 13.0–17.0)
Lymphocytes Relative: 4 % — ABNORMAL LOW (ref 12–46)
Lymphs Abs: 0.5 10*3/uL — ABNORMAL LOW (ref 0.7–4.0)
MCH: 27 pg (ref 26.0–34.0)
MCV: 82.3 fL (ref 78.0–100.0)
Monocytes Absolute: 0.7 10*3/uL (ref 0.1–1.0)
Monocytes Relative: 6 % (ref 3–12)
Platelets: 156 10*3/uL (ref 150–400)
RBC: 5.75 MIL/uL (ref 4.22–5.81)
WBC: 12.4 10*3/uL — ABNORMAL HIGH (ref 4.0–10.5)

## 2012-02-19 LAB — URINE MICROSCOPIC-ADD ON

## 2012-02-19 MED ORDER — SODIUM CHLORIDE 0.9 % IV BOLUS (SEPSIS)
1000.0000 mL | Freq: Once | INTRAVENOUS | Status: AC
  Administered 2012-02-19: 1000 mL via INTRAVENOUS

## 2012-02-19 MED ORDER — ONDANSETRON HCL 4 MG/2ML IJ SOLN
4.0000 mg | Freq: Once | INTRAMUSCULAR | Status: AC
  Administered 2012-02-19: 4 mg via INTRAVENOUS
  Filled 2012-02-19: qty 2

## 2012-02-19 MED ORDER — HYDROMORPHONE HCL PF 1 MG/ML IJ SOLN
1.0000 mg | Freq: Once | INTRAMUSCULAR | Status: AC
  Administered 2012-02-19: 1 mg via INTRAVENOUS
  Filled 2012-02-19: qty 1

## 2012-02-19 MED ORDER — OXYCODONE-ACETAMINOPHEN 5-325 MG PO TABS
2.0000 | ORAL_TABLET | Freq: Once | ORAL | Status: AC
  Administered 2012-02-19: 2 via ORAL
  Filled 2012-02-19 (×2): qty 2

## 2012-02-19 MED ORDER — SODIUM CHLORIDE 0.9 % IV SOLN
INTRAVENOUS | Status: DC
  Administered 2012-02-19: via INTRAVENOUS

## 2012-02-19 MED ORDER — HYDROMORPHONE HCL PF 1 MG/ML IJ SOLN
1.0000 mg | INTRAMUSCULAR | Status: DC | PRN
  Administered 2012-02-20: 1 mg via INTRAVENOUS
  Filled 2012-02-19: qty 1

## 2012-02-19 MED ORDER — ONDANSETRON 8 MG PO TBDP
8.0000 mg | ORAL_TABLET | Freq: Once | ORAL | Status: DC
  Filled 2012-02-19: qty 1

## 2012-02-19 NOTE — ED Notes (Signed)
Pt given ice chips per EDP

## 2012-02-19 NOTE — ED Notes (Signed)
IV attempted x2, IV team paged

## 2012-02-19 NOTE — ED Notes (Signed)
Pt c/o emesis and abd pain onset yesterday, pt seen at [facility] yesterday, dx this morning for same, states pain meds help but pain and vomiting back today. Pt states he has had multiple SBO in past. Pt states he usually gets pain meds and this helps intestines relax and unkink. Pt states emesis x 3 episodes today, pt states 8-9 each episode. Denies diarrhea.

## 2012-02-19 NOTE — ED Notes (Signed)
MD at bedside. 

## 2012-02-19 NOTE — ED Provider Notes (Signed)
History     CSN: [id number]  Arrival date & time 02/19/12  2004   First MD Initiated Contact with Patient 02/19/12 2303      Chief Complaint  Patient presents with  . Emesis    (Consider location/radiation/quality/duration/timing/severity/associated sxs/prior treatment) Patient is a 42 y.o. male presenting with vomiting.  Emesis  Associated symptoms include abdominal pain. Pertinent negatives include no chills, no diarrhea, no fever and no headaches.   Hx per PT, ABD pain and N/V with h/o SBO in the past.  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 No h/o chemo. No blood in emesis, emesis is digested food.  Last BM yesterday was normal. No F/C. Pain is located mid ABD sharp and severe waxes and wanes Past Medical History  Diagnosis Date  . IBS (irritable bowel syndrome)   . Crohn disease   . Non Hodgkin's lymphoma     Past Surgical History  Procedure Date  . Eye surgery   . Abdominal surgery     No family history on file.  History  Substance Use Topics  . Smoking status: Never Smoker   . Smokeless tobacco: Not on file  . Alcohol Use: Yes     Comment: rarely      Review of Systems  Constitutional: Negative for fever and chills.  HENT: Negative for neck pain and neck stiffness.   Eyes: Negative for pain.  Respiratory: Negative for shortness of breath.   Cardiovascular: Negative for chest pain.  Gastrointestinal: Positive for vomiting, abdominal pain and abdominal distention. Negative for diarrhea and blood in stool.  Genitourinary: Negative for dysuria.  Musculoskeletal: Negative for back pain.  Skin: Negative for rash.  Neurological: Negative for headaches.  All other systems reviewed and are negative.    Allergies  Penicillins and Latex  Home Medications   Current Outpatient Rx  Name  Route  Sig  Dispense  Refill  . OXYCODONE-ACETAMINOPHEN 5-325 MG PO TABS   Oral   Take 1 tablet by mouth every 4 (four) hours as needed. pain           . PANTOPRAZOLE SODIUM 20 MG PO TBEC   Oral   Take 20 mg by mouth daily as needed. Patient uses this medication for heartburn.           BP 116/70  Pulse 75  Temp 98.6 F (37 C) (Oral)  Resp 16  Ht 5' 11"  (1.803 m)  Wt 198 lb (89.812 kg)  BMI 27.62 kg/m2  SpO2 100%  Physical Exam  Constitutional: He is oriented to person, place, and time. He appears well-developed and well-nourished.  HENT:  Head: Normocephalic and atraumatic.  Eyes: Conjunctivae normal and EOM are normal. Pupils are equal, round, and reactive to light.  Neck: Trachea normal. Neck supple. No thyromegaly present.  Cardiovascular: Normal rate, regular rhythm, S1 normal, S2 normal and normal pulses.     No systolic murmur is present   No diastolic murmur is present  Pulses:      Radial pulses are 2+ on the right side, and 2+ on the left side.  Pulmonary/Chest: Effort normal and breath sounds normal. He has no wheezes. He has no rhonchi. He has no rales. He exhibits no tenderness.  Abdominal: Soft. Normal appearance. He exhibits no mass. There is no rebound, no guarding, no CVA tenderness and negative Murphy's sign.       Dec bowel sounds TTP mid ABD   Musculoskeletal:  BLE:s Calves nontender, no cords or erythema, negative Homans sign  Neurological: He is alert and oriented to person, place, and time. He has normal strength. No cranial nerve deficit or sensory deficit. GCS eye subscore is 4. GCS verbal subscore is 5. GCS motor subscore is 6.  Skin: Skin is warm and dry. No rash noted. He is not diaphoretic.  Psychiatric: His speech is normal.       Cooperative and appropriate    ED Course  Procedures (including critical care time)  Results for orders placed during the hospital encounter of 02/19/12  CBC      Component Value Range   WBC 5.4  4.0 - 10.5 K/uL   RBC 4.99  4.22 - 5.81 MIL/uL   Hemoglobin 13.3  13.0 - 17.0 g/dL   HCT 42.0  39.0 - 52.0 %   MCV 84.2  78.0 - 100.0 fL   MCH 26.7  26.0  - 34.0 pg   MCHC 31.7  30.0 - 36.0 g/dL   RDW 14.3  11.5 - 15.5 %   Platelets 146 (*) 150 - 400 K/uL  COMPREHENSIVE METABOLIC PANEL      Component Value Range   Sodium 136  135 - 145 mEq/L   Potassium 4.1  3.5 - 5.1 mEq/L   Chloride 101  96 - 112 mEq/L   CO2 24  19 - 32 mEq/L   Glucose, Bld 98  70 - 99 mg/dL   BUN 15  6 - 23 mg/dL   Creatinine, Ser 0.93  0.50 - 1.35 mg/dL   Calcium 9.2  8.4 - 10.5 mg/dL   Total Protein 6.6  6.0 - 8.3 g/dL   Albumin 3.5  3.5 - 5.2 g/dL   AST 15  0 - 37 U/L   ALT 10  0 - 53 U/L   Alkaline Phosphatase 76  39 - 117 U/L   Total Bilirubin 0.8  0.3 - 1.2 mg/dL   GFR calc non Af Amer >90  >90 mL/min   GFR calc Af Amer >90  >90 mL/min  LIPASE, BLOOD      Component Value Range   Lipase 31  11 - 59 U/L  URINALYSIS, ROUTINE W REFLEX MICROSCOPIC      Component Value Range   Color, Urine YELLOW  YELLOW   APPearance CLEAR  CLEAR   Specific Gravity, Urine 1.035 (*) 1.005 - 1.030   pH 5.5  5.0 - 8.0   Glucose, UA NEGATIVE  NEGATIVE mg/dL   Hgb urine dipstick NEGATIVE  NEGATIVE   Bilirubin Urine SMALL (*) NEGATIVE   Ketones, ur >80 (*) NEGATIVE mg/dL   Protein, ur 30 (*) NEGATIVE mg/dL   Urobilinogen, UA 0.2  0.0 - 1.0 mg/dL   Nitrite NEGATIVE  NEGATIVE   Leukocytes, UA NEGATIVE  NEGATIVE  URINE MICROSCOPIC-ADD ON      Component Value Range   Squamous Epithelial / LPF RARE  RARE   WBC, UA 0-2  <3 WBC/hpf   Urine-Other MUCOUS PRESENT     Ct Abdomen Pelvis W Contrast  02/20/2012  *RADIOLOGY REPORT*  Clinical Data: Continued abdominal pain, vomiting.  CT ABDOMEN AND PELVIS WITH CONTRAST  Technique:  Multidetector CT imaging of the abdomen and pelvis was performed following the standard protocol during bolus administration of intravenous contrast.  Contrast: 142m OMNIPAQUE IOHEXOL 300 MG/ML  SOLN  Comparison: Plain films 02/19/2012. CT 06/11/2008  Findings: Lung bases are clear.  No effusions.  Heart is normal size.  Liver, gallbladder,  stomach, pancreas,  adrenals and kidneys are unremarkable.  Small cyst in the mid pole of the right kidney.  No hydronephrosis.  Dilated small bowel loops are noted in the lower abdomen and pelvis.  There is a transition to decompressed small bowel within the right mid abdomen, likely mid ileum concerning for small bowel obstruction.  Distal small bowel wall is thickened with fatty hypertrophy, similar to prior study from 2010.  This may reflect prior bouts of inflammatory bowel disease.  There are enlarged mesenteric and retroperitoneal lymph nodes. Bulky left periaortic nodal mass on image 39 measures 6.4 x 4.1 cm. This was not present on prior study.  Enlarged mesenteric lymph nodes, increasing.  Index mesenteric node on image 32 has a short axis diameter of 2.2 cm.  This previously measured 12 mm.  Surgical clips are noted in the mesentery from prior lymph node dissection.  Spleen is prominent.  There are low density lesions throughout the spleen, new since prior study.  Cannot exclude lymphoma involvement.  Small amount of free fluid in the pelvis.  Large bowel is grossly unremarkable.  No free air.  Aorta and iliac vessels are normal caliber.  No acute bony abnormality.  IMPRESSION: Dilate small bowel in the lower abdomen and pelvis.  There appears to be a transition in the mid ileum suggesting small bowel obstruction.  Fatty hypertrophy within the distal small bowel wall suggests prior inflammatory bowel disease.  Small bowel obstruction could be due to stricture or adhesion.  Enlarging retroperitoneal and mesenteric lymphadenopathy concerning for recurrent lymphoma.  Prominent spleen.  Multiple small low-density areas are seen within the spleen.  Cannot exclude lymphoma involvement.  Small amount of free fluid in the pelvis.   Original Report Authenticated By: Rolm Baptise, M.D.    IVFs, NPO, IV narcotics.   2:04 AM NGT, d/w Dr Zella Richer [facility], will evaluate in the ED.   MDM   SBO. Evaluated with l;abs and CT scan as  above. Pain control IV Dilaudid. IV zofran. IVFs. [facility] consult for admit.        Teressa Lower, MD 02/21/12 [phone]

## 2012-02-19 NOTE — ED Notes (Signed)
D/c home with family to drive

## 2012-02-19 NOTE — ED Notes (Signed)
Pt c/o pain returned after eating 2-3 ice chips- EDP [facility] notified

## 2012-02-19 NOTE — ED Notes (Signed)
D/c home with ride

## 2012-02-19 NOTE — ED Provider Notes (Signed)
History     CSN: [id number]  Arrival date & time 02/18/12  2330   First MD Initiated Contact with Patient 02/19/12 0133      Chief Complaint  Patient presents with  . Abdominal Pain    (Consider location/radiation/quality/duration/timing/severity/associated sxs/prior treatment) HPIRobert Agustin Winters is a 42 y.o. male is a history of non-Hodgkin's lymphoma as well as irritable bowel syndrome presents with abdominal pain and vomiting. For approximately the last 8 hours patient is had a couple instances of vomiting, crampy severe abdominal pain. She is typically what happens is that he gets pain medicine and some nausea medicine the stomach relaxes and he feels better. Denies any diarrhea, easy bruising or bleeding, syncope, chest pain, diarrhea, hematemesis, hematochezia, melena, rash or lower extremity swelling. Patient had a bowel movement late this afternoon. Patient last passed gas sometime in the afternoon.  Dr. Della Goo oncologist for NHL Past Medical History  Diagnosis Date  . Non Hodgkin's lymphoma   . IBS (irritable bowel syndrome)   . Crohn disease     Past Surgical History  Procedure Date  . Eye surgery   . Abdominal surgery     History reviewed. No pertinent family history.  History  Substance Use Topics  . Smoking status: Never Smoker   . Smokeless tobacco: Not on file  . Alcohol Use: Yes      Review of Systems At least 10pt or greater review of systems completed and are negative except where specified in the HPI.  Allergies  Penicillins and Latex  Home Medications   Current Outpatient Rx  Name  Route  Sig  Dispense  Refill  . CYCLOBENZAPRINE HCL 10 MG PO TABS   Oral   Take 10 mg by mouth 3 (three) times daily as needed.         . OXYCODONE-ACETAMINOPHEN 10-650 MG PO TABS   Oral   Take 1 tablet by mouth every 6 (six) hours as needed.         . COLCHICINE 0.6 MG PO TABS   Oral   Take 1 tablet (0.6 mg total) by mouth daily.   10 tablet   0     . IBUPROFEN 200 MG PO TABS   Oral   Take 400 mg by mouth every 6 (six) hours as needed. Patient used this medication for his headache.         Marland Kitchen PANTOPRAZOLE SODIUM 20 MG PO TBEC   Oral   Take 20 mg by mouth daily. Patient uses this medication for heartburn.           BP 108/75  Pulse 64  Temp 97.6 F (36.4 C) (Oral)  Resp 16  Ht 5' 11"  (1.803 m)  Wt 198 lb (89.812 kg)  BMI 27.62 kg/m2  SpO2 100%  Physical Exam  Nursing notes reviewed.  Electronic medical record reviewed. VITAL SIGNS:   Filed Vitals:   02/18/12 2337 02/19/12 0531  BP: 108/75 113/65  Pulse: 64 71  Temp: 97.6 F (36.4 C)   TempSrc: Oral   Resp: 16 18  Height: 5' 11"  (1.803 m)   Weight: 198 lb (89.812 kg)   SpO2: 100% 98%   CONSTITUTIONAL: Awake, oriented, appears non-toxic HENT: Atraumatic, normocephalic, oral mucosa pink and moist, airway patent. Nares patent without drainage. External ears normal. EYES: Conjunctiva clear, EOMI, PERRLA NECK: Trachea midline, non-tender, supple CARDIOVASCULAR: Normal heart rate, Normal rhythm, No murmurs, rubs, gallops PULMONARY/CHEST: Clear to auscultation, no rhonchi, wheezes, or rales. Symmetrical breath sounds.  Non-tender. ABDOMINAL: Non-distended, soft, diffusely tender to palpation without rebound or guarding.  BS normal. NEUROLOGIC: Non-focal, moving all four extremities, no gross sensory or motor deficits. EXTREMITIES: No clubbing, cyanosis, or edema SKIN: Warm, Dry, No erythema, No rash  ED Course  Procedures (including critical care time)  Labs Reviewed  CBC WITH DIFFERENTIAL - Abnormal; Notable for the following:    WBC 12.4 (*)     Neutrophils Relative 91 (*)     Neutro Abs 11.2 (*)     Lymphocytes Relative 4 (*)     Lymphs Abs 0.5 (*)     All other components within normal limits  COMPREHENSIVE METABOLIC PANEL - Abnormal; Notable for the following:    Glucose, Bld 118 (*)     All other components within normal limits  URINALYSIS, ROUTINE W  REFLEX MICROSCOPIC - Abnormal; Notable for the following:    Color, Urine AMBER (*)  BIOCHEMICALS MAY BE AFFECTED BY COLOR   Ketones, ur >80 (*)     Protein, ur 30 (*)     All other components within normal limits  URINE MICROSCOPIC-ADD ON - Abnormal; Notable for the following:    Casts GRANULAR CAST (*)     All other components within normal limits   Dg Abd 2 Views  02/19/2012  *RADIOLOGY REPORT*  Clinical Data: Epigastric pain.  ABDOMEN - 2 VIEW  Comparison: Chest and two views abdomen 08/23/2010.  Findings: Two views of the abdomen show no free intraperitoneal air.  Bowel gas pattern is unremarkable.  Surgical clips in the left mid abdomen are noted.  IMPRESSION: No acute finding.   Original Report Authenticated By: Orlean Patten, M.D.      1. Chronic abdominal pain       MDM  Eric Winters is a 42 y.o. male with non-Hodgkin's lymphoma with a history of irritable bowel syndrome presenting with some centralized abdominal pain this feels like prior flares of his NHL versus IBS. Given the patient some pain medicine and nausea medicine he is feeling much better.  Labs are fairly unremarkable-he has a 12.4 white count which is likely secondary to vomiting. Urinalysis does show some mild dehydration. Patient is on his second liter of fluid. Patient is concerned and does not want to be discharged at this time quite yet because he is uncertain whether his pain medicine will help at home. Have given the patient some oral Percocet and another liter of fluid. Patient is tolerating his oral Percocet as well as some fluid by mouth, I think he is safe to discharge.   No obstruction seen on x-ray. I think this is likely his IBS-type pain. Nothing to suggest an acute intra-abdominal emergency at this time.  I explained the diagnosis and have given explicit precautions to return to the ER including inability to tolerate fluids, worsening pain despite medications, return of vomiting, blood in the  stools or any other new or worsening symptoms. The patient understands and accepts the medical plan as it's been dictated and I have answered their questions. Discharge instructions concerning home care and prescriptions have been given.  The patient is STABLE and is discharged to home in good condition.            Rhunette Croft, MD 02/19/12 [phone]

## 2012-02-20 ENCOUNTER — Inpatient Hospital Stay (HOSPITAL_COMMUNITY): Payer: BC Managed Care – PPO

## 2012-02-20 DIAGNOSIS — K56609 Unspecified intestinal obstruction, unspecified as to partial versus complete obstruction: Secondary | ICD-10-CM

## 2012-02-20 DIAGNOSIS — C8593 Non-Hodgkin lymphoma, unspecified, intra-abdominal lymph nodes: Secondary | ICD-10-CM | POA: Diagnosis present

## 2012-02-20 LAB — BASIC METABOLIC PANEL
BUN: 13 mg/dL (ref 6–23)
CO2: 27 mEq/L (ref 19–32)
Chloride: 102 mEq/L (ref 96–112)
Creatinine, Ser: 0.97 mg/dL (ref 0.50–1.35)
GFR calc Af Amer: >90 mL/min (ref >90)
Glucose, Bld: 117 mg/dL — ABNORMAL HIGH (ref 70–99)
Potassium: 3.9 mEq/L (ref 3.5–5.1)

## 2012-02-20 LAB — COMPREHENSIVE METABOLIC PANEL
ALT: 10 U/L (ref 0–53)
Alkaline Phosphatase: 76 U/L (ref 39–117)
BUN: 15 mg/dL (ref 6–23)
CO2: 24 mEq/L (ref 19–32)
GFR calc Af Amer: >90 mL/min (ref >90)
GFR calc non Af Amer: >90 mL/min (ref >90)
Glucose, Bld: 98 mg/dL (ref 70–99)
Potassium: 4.1 mEq/L (ref 3.5–5.1)
Sodium: 136 mEq/L (ref 135–145)
Total Bilirubin: 0.8 mg/dL (ref 0.3–1.2)
Total Protein: 6.6 g/dL (ref 6.0–8.3)

## 2012-02-20 LAB — URINALYSIS, ROUTINE W REFLEX MICROSCOPIC
Hgb urine dipstick: NEGATIVE
Ketones, ur: >80 mg/dL — AB
Nitrite: NEGATIVE
Protein, ur: 30 mg/dL — AB
Specific Gravity, Urine: 1.035 — ABNORMAL HIGH (ref 1.005–1.030)
Urobilinogen, UA: 0.2 mg/dL (ref 0.0–1.0)

## 2012-02-20 LAB — CBC
HCT: 42 % (ref 39.0–52.0)
HCT: 38 % — ABNORMAL LOW (ref 39.0–52.0)
Hemoglobin: 13.3 g/dL (ref 13.0–17.0)
Hemoglobin: 11.9 g/dL — ABNORMAL LOW (ref 13.0–17.0)
MCHC: 31.7 g/dL (ref 30.0–36.0)
MCHC: 31.3 g/dL (ref 30.0–36.0)
MCV: 84.8 fL (ref 78.0–100.0)
RBC: 4.99 MIL/uL (ref 4.22–5.81)
RDW: 14.2 % (ref 11.5–15.5)

## 2012-02-20 LAB — LIPASE, BLOOD: Lipase: 31 U/L (ref 11–59)

## 2012-02-20 MED ORDER — CHLORHEXIDINE GLUCONATE 0.12 % MT SOLN
15.0000 mL | Freq: Four times a day (QID) | OROMUCOSAL | Status: DC
  Administered 2012-02-20 – 2012-02-21 (×7): 15 mL via OROMUCOSAL
  Filled 2012-02-20 (×13): qty 15

## 2012-02-20 MED ORDER — ONDANSETRON HCL 4 MG/2ML IJ SOLN: 4.0000 mg | INTRAMUSCULAR | Status: DC | PRN

## 2012-02-20 MED ORDER — ACETAMINOPHEN 325 MG PO TABS
650.0000 mg | ORAL_TABLET | ORAL | Status: DC | PRN
  Administered 2012-02-21: 650 mg via ORAL
  Filled 2012-02-20: qty 2

## 2012-02-20 MED ORDER — IOHEXOL 300 MG/ML  SOLN
100.0000 mL | Freq: Once | INTRAMUSCULAR | Status: AC | PRN
  Administered 2012-02-20: 100 mL via INTRAVENOUS

## 2012-02-20 MED ORDER — ENOXAPARIN SODIUM 40 MG/0.4ML ~~LOC~~ SOLN
40.0000 mg | SUBCUTANEOUS | Status: DC
  Administered 2012-02-20 – 2012-02-22 (×3): 40 mg via SUBCUTANEOUS
  Filled 2012-02-20 (×3): qty 0.4

## 2012-02-20 MED ORDER — MORPHINE SULFATE 2 MG/ML IJ SOLN
2.0000 mg | INTRAMUSCULAR | Status: DC | PRN
  Administered 2012-02-20 (×3): 2 mg via INTRAVENOUS
  Filled 2012-02-20 (×3): qty 1

## 2012-02-20 MED ORDER — KCL-LACTATED RINGERS-D5W 20 MEQ/L IV SOLN
INTRAVENOUS | Status: DC
  Administered 2012-02-20 – 2012-02-21 (×4): via INTRAVENOUS
  Administered 2012-02-21: 75 mL/h via INTRAVENOUS
  Filled 2012-02-20 (×8): qty 1000

## 2012-02-20 MED ORDER — PANTOPRAZOLE SODIUM 40 MG IV SOLR
40.0000 mg | INTRAVENOUS | Status: DC
  Administered 2012-02-20 – 2012-02-22 (×3): 40 mg via INTRAVENOUS
  Filled 2012-02-20 (×3): qty 40

## 2012-02-20 MED ORDER — BIOTENE DRY MOUTH MT LIQD: 15.0000 mL | OROMUCOSAL | Status: DC | PRN

## 2012-02-20 NOTE — ED Notes (Signed)
Attempted to call report to 5W RN will return call

## 2012-02-20 NOTE — Progress Notes (Signed)
Subjective: Feels better.  No n/v.  Passing some gas now.  Objective: Vital signs in last 24 hours: Temp:  [98.2 F (36.8 C)-98.6 F (37 C)] 98.2 F (36.8 C) (01/11 0605) Pulse Rate:  [73-75] 73  (01/11 0605) Resp:  [16] 16  (01/11 0605) BP: (100-116)/(60-70) 100/60 mmHg (01/11 0605) SpO2:  [98 %-100 %] 98 % (01/11 0605) Weight:  [198 lb (89.812 kg)] 198 lb (89.812 kg) (01/10 2208) Last BM Date: 02/18/12  Intake/Output from previous day: 01/10 0701 - 01/11 0700 In: 0  Out: 450 [Urine:450] Intake/Output this shift:    PE: General- In NAD Abdomen-soft, nontender, nondistended  Lab Results:   Basename 02/20/12 0745 02/19/12 2309  WBC 5.0 5.4  HGB 11.9* 13.3  HCT 38.0* 42.0  PLT 135* 146*   BMET  Basename 02/20/12 0745 02/19/12 2309  NA 136 136  K 3.9 4.1  CL 102 101  CO2 27 24  GLUCOSE 117* 98  BUN 13 15  CREATININE 0.97 0.93  CALCIUM 8.8 9.2   PT/INR No results found for this basename: LABPROT:2,INR:2 in the last 72 hours Comprehensive Metabolic Panel:    Component Value Date/Time   NA 136 02/20/2012 0745   K 3.9 02/20/2012 0745   CL 102 02/20/2012 0745   CO2 27 02/20/2012 0745   BUN 13 02/20/2012 0745   CREATININE 0.97 02/20/2012 0745   GLUCOSE 117* 02/20/2012 0745   CALCIUM 8.8 02/20/2012 0745   AST 15 02/19/2012 2309   ALT 10 02/19/2012 2309   ALKPHOS 76 02/19/2012 2309   BILITOT 0.8 02/19/2012 2309   PROT 6.6 02/19/2012 2309   ALBUMIN 3.5 02/19/2012 2309     Studies/Results: Ct Abdomen Pelvis W Contrast  02/20/2012  *RADIOLOGY REPORT*  Clinical Data: Continued abdominal pain, vomiting.  CT ABDOMEN AND PELVIS WITH CONTRAST  Technique:  Multidetector CT imaging of the abdomen and pelvis was performed following the standard protocol during bolus administration of intravenous contrast.  Contrast: 180m OMNIPAQUE IOHEXOL 300 MG/ML  SOLN  Comparison: Plain films 02/19/2012. CT 06/11/2008  Findings: Lung bases are clear.  No effusions.  Heart is normal size.   Liver, gallbladder, stomach, pancreas, adrenals and kidneys are unremarkable.  Small cyst in the mid pole of the right kidney.  No hydronephrosis.  Dilated small bowel loops are noted in the lower abdomen and pelvis.  There is a transition to decompressed small bowel within the right mid abdomen, likely mid ileum concerning for small bowel obstruction.  Distal small bowel wall is thickened with fatty hypertrophy, similar to prior study from 2010.  This may reflect prior bouts of inflammatory bowel disease.  There are enlarged mesenteric and retroperitoneal lymph nodes. Bulky left periaortic nodal mass on image 39 measures 6.4 x 4.1 cm. This was not present on prior study.  Enlarged mesenteric lymph nodes, increasing.  Index mesenteric node on image 32 has a short axis diameter of 2.2 cm.  This previously measured 12 mm.  Surgical clips are noted in the mesentery from prior lymph node dissection.  Spleen is prominent.  There are low density lesions throughout the spleen, new since prior study.  Cannot exclude lymphoma involvement.  Small amount of free fluid in the pelvis.  Large bowel is grossly unremarkable.  No free air.  Aorta and iliac vessels are normal caliber.  No acute bony abnormality.  IMPRESSION: Dilate small bowel in the lower abdomen and pelvis.  There appears to be a transition in the mid ileum suggesting small bowel  obstruction.  Fatty hypertrophy within the distal small bowel wall suggests prior inflammatory bowel disease.  Small bowel obstruction could be due to stricture or adhesion.  Enlarging retroperitoneal and mesenteric lymphadenopathy concerning for recurrent lymphoma.  Prominent spleen.  Multiple small low-density areas are seen within the spleen.  Cannot exclude lymphoma involvement.  Small amount of free fluid in the pelvis.   Original Report Authenticated By: Rolm Baptise, M.D.    Dg Abd 2 Views  02/19/2012  *RADIOLOGY REPORT*  Clinical Data: Epigastric pain.  ABDOMEN - 2 VIEW   Comparison: Chest and two views abdomen 08/23/2010.  Findings: Two views of the abdomen show no free intraperitoneal air.  Bowel gas pattern is unremarkable.  Surgical clips in the left mid abdomen are noted.  IMPRESSION: No acute finding.   Original Report Authenticated By: Orlean Patten, M.D.     Anti-infectives: Anti-infectives    None      Assessment Principal Problem:  *SBO (small bowel obstruction)-partial, recurrent Active Problems:  Non-Hodgkin lymphoma of intra-abdominal lymph nodes    LOS: 1 day   Plan: Continue bowel rest.  Check x-rays.   Eric Winters 02/20/2012

## 2012-02-20 NOTE — H&P (Signed)
Eric Winters is an 42 y.o. male.   Chief Complaint: Abdominal pain, distention, nausea and vomiting HPI: This is a 42 year old male with non-Hodgkin's lymphoma, intra-abdominal, began having some abdominal distention and nausea and vomiting about 36 hours ago. He has not had a bowel movement or passed gas since that time. He was seen in the [facility] emergency department yesterday morning and thought to have a viral gastritis enteritis and was sent home. However his symptoms persisted and presented to [facility] Where a CT scan was performed demonstrating findings consistent with a small bowel obstruction. I was asked to see him at this time. Since taking the oral contrast he states he is less distended and starting to feel better. Last time he threw up it was small amount of yellowish clear material. No bilious emesis.  He states he has had multiple episodes like this before but they have been self-limited.  Past Medical History  Diagnosis Date  . IBS (irritable bowel syndrome)   . Crohn disease   . Non Hodgkin's lymphoma     Past Surgical History  Procedure Date  . Eye surgery   . Abdominal surgery     No family history on file. Social History:  reports that he has never smoked. He does not have any smokeless tobacco history on file. He reports that he drinks alcohol. He reports that he does not use illicit drugs.  Prior to Admission medications   Medication Sig Start Date End Date Taking? Authorizing Provider  oxyCODONE-acetaminophen (PERCOCET/ROXICET) 5-325 MG per tablet Take 1 tablet by mouth every 4 (four) hours as needed. pain   Yes Historical Provider, MD  pantoprazole (PROTONIX) 20 MG tablet Take 20 mg by mouth daily as needed. Patient uses this medication for heartburn.   Yes Historical Provider, MD     Allergies:  Allergies  Allergen Reactions  . Penicillins Hives  . Latex Rash     (Not in a hospital admission)  Results for orders placed during the hospital  encounter of 02/19/12 (from the past 48 hour(s))  CBC     Status: Abnormal   Collection Time   02/19/12 11:09 PM      Component Value Range Comment   WBC 5.4  4.0 - 10.5 K/uL    RBC 4.99  4.22 - 5.81 MIL/uL    Hemoglobin 13.3  13.0 - 17.0 g/dL    HCT 42.0  39.0 - 52.0 %    MCV 84.2  78.0 - 100.0 fL    MCH 26.7  26.0 - 34.0 pg    MCHC 31.7  30.0 - 36.0 g/dL    RDW 14.3  11.5 - 15.5 %    Platelets 146 (*) 150 - 400 K/uL   COMPREHENSIVE METABOLIC PANEL     Status: Normal   Collection Time   02/19/12 11:09 PM      Component Value Range Comment   Sodium 136  135 - 145 mEq/L    Potassium 4.1  3.5 - 5.1 mEq/L    Chloride 101  96 - 112 mEq/L    CO2 24  19 - 32 mEq/L    Glucose, Bld 98  70 - 99 mg/dL    BUN 15  6 - 23 mg/dL    Creatinine, Ser 0.93  0.50 - 1.35 mg/dL    Calcium 9.2  8.4 - 10.5 mg/dL    Total Protein 6.6  6.0 - 8.3 g/dL    Albumin 3.5  3.5 - 5.2 g/dL  AST 15  0 - 37 U/L    ALT 10  0 - 53 U/L    Alkaline Phosphatase 76  39 - 117 U/L    Total Bilirubin 0.8  0.3 - 1.2 mg/dL    GFR calc non Af Amer >90  >90 mL/min    GFR calc Af Amer >90  >90 mL/min   LIPASE, BLOOD     Status: Normal   Collection Time   02/19/12 11:09 PM      Component Value Range Comment   Lipase 31  11 - 59 U/L   URINALYSIS, ROUTINE W REFLEX MICROSCOPIC     Status: Abnormal   Collection Time   02/20/12  1:11 AM      Component Value Range Comment   Color, Urine YELLOW  YELLOW    APPearance CLEAR  CLEAR    Specific Gravity, Urine 1.035 (*) 1.005 - 1.030    pH 5.5  5.0 - 8.0    Glucose, UA NEGATIVE  NEGATIVE mg/dL    Hgb urine dipstick NEGATIVE  NEGATIVE    Bilirubin Urine SMALL (*) NEGATIVE    Ketones, ur >80 (*) NEGATIVE mg/dL    Protein, ur 30 (*) NEGATIVE mg/dL    Urobilinogen, UA 0.2  0.0 - 1.0 mg/dL    Nitrite NEGATIVE  NEGATIVE    Leukocytes, UA NEGATIVE  NEGATIVE   URINE MICROSCOPIC-ADD ON     Status: Normal   Collection Time   02/20/12  1:11 AM      Component Value Range Comment    Squamous Epithelial / LPF RARE  RARE    WBC, UA 0-2  <3 WBC/hpf    Urine-Other MUCOUS PRESENT      Ct Abdomen Pelvis W Contrast  02/20/2012  *RADIOLOGY REPORT*  Clinical Data: Continued abdominal pain, vomiting.  CT ABDOMEN AND PELVIS WITH CONTRAST  Technique:  Multidetector CT imaging of the abdomen and pelvis was performed following the standard protocol during bolus administration of intravenous contrast.  Contrast: 111m OMNIPAQUE IOHEXOL 300 MG/ML  SOLN  Comparison: Plain films 02/19/2012. CT 06/11/2008  Findings: Lung bases are clear.  No effusions.  Heart is normal size.  Liver, gallbladder, stomach, pancreas, adrenals and kidneys are unremarkable.  Small cyst in the mid pole of the right kidney.  No hydronephrosis.  Dilated small bowel loops are noted in the lower abdomen and pelvis.  There is a transition to decompressed small bowel within the right mid abdomen, likely mid ileum concerning for small bowel obstruction.  Distal small bowel wall is thickened with fatty hypertrophy, similar to prior study from 2010.  This may reflect prior bouts of inflammatory bowel disease.  There are enlarged mesenteric and retroperitoneal lymph nodes. Bulky left periaortic nodal mass on image 39 measures 6.4 x 4.1 cm. This was not present on prior study.  Enlarged mesenteric lymph nodes, increasing.  Index mesenteric node on image 32 has a short axis diameter of 2.2 cm.  This previously measured 12 mm.  Surgical clips are noted in the mesentery from prior lymph node dissection.  Spleen is prominent.  There are low density lesions throughout the spleen, new since prior study.  Cannot exclude lymphoma involvement.  Small amount of free fluid in the pelvis.  Large bowel is grossly unremarkable.  No free air.  Aorta and iliac vessels are normal caliber.  No acute bony abnormality.  IMPRESSION: Dilate small bowel in the lower abdomen and pelvis.  There appears to be a transition in the  mid ileum suggesting small bowel  obstruction.  Fatty hypertrophy within the distal small bowel wall suggests prior inflammatory bowel disease.  Small bowel obstruction could be due to stricture or adhesion.  Enlarging retroperitoneal and mesenteric lymphadenopathy concerning for recurrent lymphoma.  Prominent spleen.  Multiple small low-density areas are seen within the spleen.  Cannot exclude lymphoma involvement.  Small amount of free fluid in the pelvis.   Original Report Authenticated By: Rolm Baptise, M.D.    Dg Abd 2 Views  02/19/2012  *RADIOLOGY REPORT*  Clinical Data: Epigastric pain.  ABDOMEN - 2 VIEW  Comparison: Chest and two views abdomen 08/23/2010.  Findings: Two views of the abdomen show no free intraperitoneal air.  Bowel gas pattern is unremarkable.  Surgical clips in the left mid abdomen are noted.  IMPRESSION: No acute finding.   Original Report Authenticated By: Orlean Patten, M.D.     Review of Systems  Constitutional: Negative for fever, chills and weight loss.  Respiratory: Negative for shortness of breath.   Cardiovascular: Negative for chest pain.  Gastrointestinal: Positive for nausea, vomiting and abdominal pain.  Genitourinary: Negative for dysuria and hematuria.  Neurological: Negative for seizures.  Endo/Heme/Allergies: Does not bruise/bleed easily.    Blood pressure 116/70, pulse 75, temperature 98.6 F (37 C), temperature source Oral, resp. rate 16, height 5' 11"  (1.803 m), weight 198 lb (89.812 kg), SpO2 100.00%. Physical Exam  Constitutional: He appears well-developed and well-nourished. No distress.  HENT:  Head: Normocephalic and atraumatic.       Mucous membranes dry  Eyes: No scleral icterus.  Neck: Neck supple.  Cardiovascular: Normal rate and regular rhythm.   Respiratory: Effort normal and breath sounds normal.  GI: Soft. He exhibits distension (mild). He exhibits no mass. There is no tenderness.       Midline scar with no hernia. Active bowel sounds are noted.  Genitourinary:         No palpable hernias.  Rectal exam-no external lesions. No masses. No stool in rectal vault. No blood. Prostate is smooth without nodules.  Musculoskeletal: He exhibits no edema.       No axillary or inguinal adenopathy.  Lymphadenopathy:    He has no cervical adenopathy.  Neurological: He is alert.  Skin: Skin is warm and dry.  Psychiatric: He has a normal mood and affect.     Assessment/Plan Small bowel obstruction potentially secondary to adhesions versus progression of disease. There has been some question in the past of whether he has inflammatory bowel disease or not in the area of the obstruction on the right side.  His oncologist is in [facility] and he states the adenopathy in the intra-abdominal region has been progressing but he has not received any form of chemotherapy at this time.  Plan: Admit to the hospital. Since he's feeling better, we will hold on the NG tube insertion unless he vomits again.  Will insert NG tube at that time. Start IV fluid hydration.  If nonoperative management is successful, I recommended he go back and see his oncologist to consider starting treatment. Tiffane Sheldon J 02/20/2012, 3:02 AM

## 2012-02-21 NOTE — Progress Notes (Signed)
Subjective: Some abdominal muscle soreness.  Had 3 BMs.  Objective: Vital signs in last 24 hours: Temp:  [97.8 F (36.6 C)-98.4 F (36.9 C)] 98.1 F (36.7 C) (01/12 0553) Pulse Rate:  [51-75] 60  (01/12 0553) Resp:  [16-20] 16  (01/12 0553) BP: (96-112)/(53-64) 109/59 mmHg (01/12 0553) SpO2:  [94 %-98 %] 97 % (01/12 0553) Last BM Date: 02/18/12  Intake/Output from previous day: 01/11 0701 - 01/12 0700 In: 3755 [I.V.:3755] Out: 2875 [Urine:2875] Intake/Output this shift:    PE: General- In NAD Abdomen-soft, nontender, nondistended  Lab Results:   [facility] 02/20/12 0745 02/19/12 2309  WBC 5.0 5.4  HGB 11.9* 13.3  HCT 38.0* 42.0  PLT 135* 146*   BMET  Basename 02/20/12 0745 02/19/12 2309  NA 136 136  K 3.9 4.1  CL 102 101  CO2 27 24  GLUCOSE 117* 98  BUN 13 15  CREATININE 0.97 0.93  CALCIUM 8.8 9.2   PT/INR No results found for this basename: LABPROT:2,INR:2 in the last 72 hours Comprehensive Metabolic Panel:    Component Value Date/Time   NA 136 02/20/2012 0745   K 3.9 02/20/2012 0745   CL 102 02/20/2012 0745   CO2 27 02/20/2012 0745   BUN 13 02/20/2012 0745   CREATININE 0.97 02/20/2012 0745   GLUCOSE 117* 02/20/2012 0745   CALCIUM 8.8 02/20/2012 0745   AST 15 02/19/2012 2309   ALT 10 02/19/2012 2309   ALKPHOS 76 02/19/2012 2309   BILITOT 0.8 02/19/2012 2309   PROT 6.6 02/19/2012 2309   ALBUMIN 3.5 02/19/2012 2309     Studies/Results: Ct Abdomen Pelvis W Contrast  02/20/2012  *RADIOLOGY REPORT*  Clinical Data: Continued abdominal pain, vomiting.  CT ABDOMEN AND PELVIS WITH CONTRAST  Technique:  Multidetector CT imaging of the abdomen and pelvis was performed following the standard protocol during bolus administration of intravenous contrast.  Contrast: 18m OMNIPAQUE IOHEXOL 300 MG/ML  SOLN  Comparison: Plain films 02/19/2012. CT 06/11/2008  Findings: Lung bases are clear.  No effusions.  Heart is normal size.  Liver, gallbladder, stomach, pancreas, adrenals  and kidneys are unremarkable.  Small cyst in the mid pole of the right kidney.  No hydronephrosis.  Dilated small bowel loops are noted in the lower abdomen and pelvis.  There is a transition to decompressed small bowel within the right mid abdomen, likely mid ileum concerning for small bowel obstruction.  Distal small bowel wall is thickened with fatty hypertrophy, similar to prior study from 2010.  This may reflect prior bouts of inflammatory bowel disease.  There are enlarged mesenteric and retroperitoneal lymph nodes. Bulky left periaortic nodal mass on image 39 measures 6.4 x 4.1 cm. This was not present on prior study.  Enlarged mesenteric lymph nodes, increasing.  Index mesenteric node on image 32 has a short axis diameter of 2.2 cm.  This previously measured 12 mm.  Surgical clips are noted in the mesentery from prior lymph node dissection.  Spleen is prominent.  There are low density lesions throughout the spleen, new since prior study.  Cannot exclude lymphoma involvement.  Small amount of free fluid in the pelvis.  Large bowel is grossly unremarkable.  No free air.  Aorta and iliac vessels are normal caliber.  No acute bony abnormality.  IMPRESSION: Dilate small bowel in the lower abdomen and pelvis.  There appears to be a transition in the mid ileum suggesting small bowel obstruction.  Fatty hypertrophy within the distal small bowel wall suggests prior inflammatory bowel  disease.  Small bowel obstruction could be due to stricture or adhesion.  Enlarging retroperitoneal and mesenteric lymphadenopathy concerning for recurrent lymphoma.  Prominent spleen.  Multiple small low-density areas are seen within the spleen.  Cannot exclude lymphoma involvement.  Small amount of free fluid in the pelvis.   Original Report Authenticated By: Rolm Baptise, M.D.    Dg Abd 2 Views  02/20/2012  *RADIOLOGY REPORT*  Clinical Data: Small bowel obstruction.  Abdominal pain.  ABDOMEN - 2 VIEW  Comparison: 02/19/2012.   Findings: Gas, stool and oral contrast material is seen throughout the colon extending to the level of the distal rectum.  In the central abdomen there is a dilated loop of small bowel measuring up to 6.1 cm in diameter with an air-fluid level on the upright projection.  No other small bowel dilatation is noted.  Surgical clips in the mid left abdomen.  No pneumoperitoneum.  IMPRESSION: 1.  Nonspecific bowel gas pattern, as above, concerning for early or partial small bowel obstruction. 2.  No pneumoperitoneum.   Original Report Authenticated By: Vinnie Langton, M.D.     Anti-infectives: Anti-infectives    None      Assessment Principal Problem:  *SBO (small bowel obstruction)-partial, recurrent:  Bowel function has returned.  Contrast through to rectum on yesterday's x-ray. Active Problems:  Non-Hodgkin lymphoma of intra-abdominal lymph nodes    LOS: 2 days   Plan: Full liquid diet.  I recommended he see his Oncologist in [facility] after discharge and discuss the possibility of starting treatments.   Krisalyn Yankowski J 02/21/2012

## 2012-02-22 ENCOUNTER — Encounter (HOSPITAL_COMMUNITY): Payer: Self-pay | Admitting: Surgery

## 2012-02-22 ENCOUNTER — Encounter (INDEPENDENT_AMBULATORY_CARE_PROVIDER_SITE_OTHER): Payer: Self-pay | Admitting: General Surgery

## 2012-02-22 DIAGNOSIS — K589 Irritable bowel syndrome without diarrhea: Secondary | ICD-10-CM | POA: Insufficient documentation

## 2012-02-22 DIAGNOSIS — K5090 Crohn's disease, unspecified, without complications: Secondary | ICD-10-CM | POA: Insufficient documentation

## 2012-02-22 MED ORDER — ACETAMINOPHEN 325 MG PO TABS: 325.0000 mg | ORAL_TABLET | Freq: Four times a day (QID) | ORAL | Status: DC | PRN

## 2012-02-22 MED ORDER — ACETAMINOPHEN 650 MG RE SUPP: 650.0000 mg | Freq: Four times a day (QID) | RECTAL | Status: DC | PRN

## 2012-02-22 MED ORDER — SODIUM CHLORIDE 0.9 % IV SOLN: 250.0000 mL | INTRAVENOUS | Status: DC | PRN

## 2012-02-22 MED ORDER — SODIUM CHLORIDE 0.9 % IJ SOLN: 3.0000 mL | INTRAMUSCULAR | Status: DC | PRN

## 2012-02-22 MED ORDER — PANTOPRAZOLE SODIUM 20 MG PO TBEC: 20.0000 mg | DELAYED_RELEASE_TABLET | Freq: Every day | ORAL | Status: DC

## 2012-02-22 MED ORDER — SODIUM CHLORIDE 0.9 % IJ SOLN: 3.0000 mL | Freq: Two times a day (BID) | INTRAMUSCULAR | Status: DC

## 2012-02-22 NOTE — Progress Notes (Addendum)
Wife in room. Pt feels much better Abd soft NT/ND.  Nondistended D/C patient from hospital when patient meets criteria (probably after lunch):  Tolerating oral intake well Ambulating in walkways Adequate pain control without IV medications Urinating  Having flatus  Agree w outpt f/u w oncology in [place] for lymphoma & [facility]/Dr Buccini for Crohn's  Patient Care Team: [name], MD as PCP - General (Internal Medicine) Cleotis Nipper, MD as Consulting Physician (Gastroenterology) V.C. Harish as Consulting Physician (Hematology and Oncology)   He feels good and would like to go home. I will get him ready he knows to follow up with Dr.ish and Dr. [name].

## 2012-02-22 NOTE — Care Management Note (Signed)
    Page 1 of 1   02/22/2012     2:04:53 PM   CARE MANAGEMENT NOTE 02/22/2012  Patient:  Eric Winters, Eric Winters   Account Number:  [id number]  Date Initiated:  02/22/2012  Documentation initiated by:  Sunday Spillers  Subjective/Objective Assessment:   42 yo male admitted with SBO. PTA lived at home with spouse.     Action/Plan:   Home when stable   Anticipated DC Date:  02/22/2012   Anticipated DC Plan:  [place]  CM consult      Choice offered to / List presented to:             Status of service:  Completed, signed off Medicare Important Message given?   (If response is "NO", the following Medicare IM given date fields will be blank) Date Medicare IM given:   Date Additional Medicare IM given:    Discharge Disposition:  HOME/SELF CARE  Per UR Regulation:  Reviewed for med. necessity/level of care/duration of stay  If discussed at [place] of Stay Meetings, dates discussed:    Comments:

## 2012-02-22 NOTE — Progress Notes (Signed)
  Subjective: Continues to do well.  Ready to advance diet.  Objective: Vital signs in last 24 hours: Temp:  [97.9 F (36.6 C)-98.5 F (36.9 C)] 97.9 F (36.6 C) (01/13 0549) Pulse Rate:  [54-65] 57  (01/13 0549) Resp:  [16-18] 18  (01/13 0549) BP: (90-118)/(53-75) 90/53 mmHg (01/13 0549) SpO2:  [96 %-99 %] 99 % (01/13 0549) Last BM Date: 02/21/12  720 PO recorded, 4 stools on 2022/03/21, and 1 stool yesterday. Full liquid diet abebrile BP 01-027'O systolic range.  No labs Intake/Output from previous day: 01/12 0701 - 01/13 0700 In: 2841.3 [P.O.:720; I.V.:2121.3] Out: 1900 [Urine:1900] Intake/Output this shift:    General appearance: alert, cooperative and no distress GI: soft, non-tender; bowel sounds normal; no masses,  no organomegaly and he says he's still sore,but anxious to move on and get CT results to oncologist to compare with Nov 2013 study.  Lab Results:   [facility] 21-Mar-2012 0745 02/19/12 2309  WBC 5.0 5.4  HGB 11.9* 13.3  HCT 38.0* 42.0  PLT 135* 146*    BMET  Basename 2012-03-21 0745 02/19/12 2309  NA 136 136  K 3.9 4.1  CL 102 101  CO2 27 24  GLUCOSE 117* 98  BUN 13 15  CREATININE 0.97 0.93  CALCIUM 8.8 9.2   PT/INR No results found for this basename: LABPROT:2,INR:2 in the last 72 hours   Lab 02/19/12 2309 02/19/12 0100  AST 15 22  ALT 10 12  ALKPHOS 76 92  BILITOT 0.8 0.8  PROT 6.6 7.4  ALBUMIN 3.5 4.1     Lipase     Component Value Date/Time   LIPASE 31 02/19/2012 2309     Studies/Results: Dg Abd 2 Views  21-Mar-2012  *RADIOLOGY REPORT*  Clinical Data: Small bowel obstruction.  Abdominal pain.  ABDOMEN - 2 VIEW  Comparison: 02/19/2012.  Findings: Gas, stool and oral contrast material is seen throughout the colon extending to the level of the distal rectum.  In the central abdomen there is a dilated loop of small bowel measuring up to 6.1 cm in diameter with an air-fluid level on the upright projection.  No other small bowel dilatation is  noted.  Surgical clips in the mid left abdomen.  No pneumoperitoneum.  IMPRESSION: 1.  Nonspecific bowel gas pattern, as above, concerning for early or partial small bowel obstruction. 2.  No pneumoperitoneum.   Original Report Authenticated By: Vinnie Langton, M.D.     Medications:    . chlorhexidine  15 mL Mouth/Throat QID  . enoxaparin (LOVENOX) injection  40 mg Subcutaneous Q24H  . pantoprazole (PROTONIX) IV  40 mg Intravenous Q24H    Assessment/Plan SBO, mid ileum, possible inflammatory disease Non Hodgkin's lymphoma, followed in [place], no rx so far, CT shows enlarging retroperitoneal and mesenteric lymphadenopathy. Possible Crohn's disease terminal ileum going back to 2003 admit. BX at that time led to dx of Non-Hodkin's lymphoma.   Plan:  Low residual diet, and mobilize, hope to send home later today.  Follow up with oncology in [place], Dr. Kimberlee Nearing.  LOS: 3 days    Eric Winters 02/22/2012

## 2012-02-22 NOTE — Progress Notes (Signed)
Wife in room. Pt feels much better Abd soft NT/ND.  Nondistended D/C patient from hospital when patient meets criteria (probably after lunch):  Tolerating oral intake well Ambulating in walkways Adequate pain control without IV medications Urinating  Having flatus  Agree w outpt f/u w oncology in [place] for lymphoma & [facility]/Dr Buccini for Crohn's  Patient Care Team: [name], MD as PCP - General (Internal Medicine) Cleotis Nipper, MD as Consulting Physician (Gastroenterology) V.C. Harish as Consulting Physician (Hematology and Oncology)   He feels good and would like to go home. I will get him ready he knows to follow up with Dr.ish and Dr. [name].

## 2012-02-22 NOTE — Discharge Summary (Signed)
Physician Discharge Summary  Patient ID: Moustapha Tooker MRN: [id number] DOB/AGE: 44-07-1970 44 y.o.  Admit date: 02/19/2012 Discharge date: 02/22/2012  Admission Diagnoses: SBO Non-Hodgkin's lymphoma Crohn's/IBS  Discharge Diagnoses: Same Principal Problem:  *SBO (small bowel obstruction) Active Problems:  Non-Hodgkin lymphoma of intra-abdominal lymph nodes   PROCEDURES: None  Hospital Course: This is a 42 year old male with non-Hodgkin's lymphoma, intra-abdominal, began having some abdominal distention and nausea and vomiting about 36 hours ago. He has not had a bowel movement or passed gas since that time. He was seen in the [facility] emergency department yesterday morning and thought to have a viral gastritis enteritis and was sent home. However his symptoms persisted and presented to [facility] Where a CT scan was performed demonstrating findings consistent with a small bowel obstruction. I was asked to see him at this time. Since taking the oral contrast he states he is less distended and starting to feel better. Last time he threw up it was small amount of yellowish clear material. No bilious emesis. He states he has had multiple episodes like this before but they have been self-limited. He was admitted by Dr. Zella Richer over the weekend and placed on bowel rest.  He and has quickly improved.   There has been some question in the past of whether he has inflammatory bowel disease or not in the area of the obstruction on the right side. His oncologist is in [facility] and he states the adenopathy in the intra-abdominal region has been progressing but he has not received any form of chemotherapy at this time. By 02/22/12 he was doing well, he's tolerating full diet, and is anxious to go home.  We are getting a copy of his Ct for Dr. Cruzita Lederer to review and compare with his last study.  He can follow up with oncology, Dr. Delma Officer GI and our office if he needs more help.    Condition on D/C:   Improved.   Disposition: 01-Home or Self Care     Medication List     As of 02/22/2012  2:51 PM    TAKE these medications         acetaminophen 325 MG tablet   Commonly known as: TYLENOL   Take 1-2 tablets (325-650 mg total) by mouth every 6 (six) hours as needed.      oxyCODONE-acetaminophen 5-325 MG per tablet   Commonly known as: PERCOCET/ROXICET   Take 1 tablet by mouth every 4 (four) hours as needed. pain      pantoprazole 20 MG tablet   Commonly known as: PROTONIX   Take 20 mg by mouth daily as needed. Patient uses this medication for heartburn.        Follow-up Information    Follow up with HARISH,V C, MD. Schedule an appointment as soon as possible for a visit in 1 week.      Schedule an appointment as soon as possible for a visit with Cleotis Nipper, MD. (As needed)    Contact information:   [address] [place], [place] [address] [phone]       Call [name], MD. (As needed)    Contact information:   [address]  [place]  [address] [place] [address]       Call Odis Hollingshead, MD. (As needed)  Contact information:   [address] [place] [address] [phone]          Signed: Earnstine Regal 02/22/2012, 2:51 PM

## 2012-02-22 NOTE — Discharge Summary (Signed)
Feeling better  Tolerating solids OK to d/c

## 2012-02-23 NOTE — Progress Notes (Signed)
Adin Hector, M.D., F.A.C.S. Gastrointestinal and Minimally Invasive Surgery [facility] [address], [place], [place] [address] [phone] Main / Paging [phone] Voice Mail

## 2012-03-26 ENCOUNTER — Other Ambulatory Visit: Payer: Self-pay

## 2012-12-15 ENCOUNTER — Other Ambulatory Visit: Payer: Self-pay

## 2013-11-28 ENCOUNTER — Observation Stay (HOSPITAL_COMMUNITY)
Admission: EM | Admit: 2013-11-28 | Discharge: 2013-11-29 | Disposition: A | Discharge status: Home / Self Care | Payer: 59 | Attending: Internal Medicine | Admitting: Internal Medicine

## 2013-11-28 ENCOUNTER — Emergency Department (HOSPITAL_COMMUNITY): Payer: 59

## 2013-11-28 ENCOUNTER — Encounter (HOSPITAL_COMMUNITY): Payer: Self-pay | Admitting: Emergency Medicine

## 2013-11-28 DIAGNOSIS — R1110 Vomiting, unspecified: Secondary | ICD-10-CM | POA: Diagnosis present

## 2013-11-28 DIAGNOSIS — Z79899 Other long term (current) drug therapy: Secondary | ICD-10-CM | POA: Insufficient documentation

## 2013-11-28 DIAGNOSIS — R109 Unspecified abdominal pain: Secondary | ICD-10-CM | POA: Diagnosis present

## 2013-11-28 DIAGNOSIS — K589 Irritable bowel syndrome without diarrhea: Secondary | ICD-10-CM | POA: Diagnosis not present

## 2013-11-28 DIAGNOSIS — K5090 Crohn's disease, unspecified, without complications: Secondary | ICD-10-CM

## 2013-11-28 DIAGNOSIS — Z8571 Personal history of Hodgkin lymphoma: Secondary | ICD-10-CM | POA: Insufficient documentation

## 2013-11-28 DIAGNOSIS — Z91040 Latex allergy status: Secondary | ICD-10-CM | POA: Insufficient documentation

## 2013-11-28 DIAGNOSIS — R1031 Right lower quadrant pain: Secondary | ICD-10-CM | POA: Diagnosis present

## 2013-11-28 DIAGNOSIS — Z880 Allergy status to penicillin: Secondary | ICD-10-CM | POA: Diagnosis not present

## 2013-11-28 DIAGNOSIS — R112 Nausea with vomiting, unspecified: Principal | ICD-10-CM | POA: Insufficient documentation

## 2013-11-28 LAB — URINALYSIS, ROUTINE W REFLEX MICROSCOPIC
BILIRUBIN URINE: NEGATIVE
Glucose, UA: NEGATIVE mg/dL
HGB URINE DIPSTICK: NEGATIVE
Ketones, ur: 15 mg/dL — AB
Leukocytes, UA: NEGATIVE
NITRITE: NEGATIVE
PROTEIN: NEGATIVE mg/dL
Specific Gravity, Urine: 1.022 (ref 1.005–1.030)
UROBILINOGEN UA: 0.2 mg/dL (ref 0.0–1.0)
pH: 7.5 (ref 5.0–8.0)

## 2013-11-28 LAB — HEPATIC FUNCTION PANEL
ALT: 20 U/L (ref 0–53)
AST: 26 U/L (ref 0–37)
Albumin: 4.1 g/dL (ref 3.5–5.2)
Alkaline Phosphatase: 83 U/L (ref 39–117)
BILIRUBIN TOTAL: 0.9 mg/dL (ref 0.3–1.2)
Total Protein: 7.8 g/dL (ref 6.0–8.3)

## 2013-11-28 LAB — I-STAT CHEM 8, ED
BUN: 15 mg/dL (ref 6–23)
CALCIUM ION: 1.18 mmol/L (ref 1.12–1.23)
CHLORIDE: 102 meq/L (ref 96–112)
Creatinine, Ser: 1.3 mg/dL (ref 0.50–1.35)
GLUCOSE: 119 mg/dL — AB (ref 70–99)
HEMATOCRIT: 51 % (ref 39.0–52.0)
Hemoglobin: 17.3 g/dL — ABNORMAL HIGH (ref 13.0–17.0)
Potassium: 4.1 mEq/L (ref 3.7–5.3)
Sodium: 139 mEq/L (ref 137–147)
TCO2: 27 mmol/L (ref 0–100)

## 2013-11-28 LAB — CBC WITH DIFFERENTIAL/PLATELET
BASOS ABS: 0 10*3/uL (ref 0.0–0.1)
BASOS PCT: 0 % (ref 0–1)
EOS ABS: 0.1 10*3/uL (ref 0.0–0.7)
Eosinophils Relative: 1 % (ref 0–5)
HEMATOCRIT: 47.3 % (ref 39.0–52.0)
HEMOGLOBIN: 15.2 g/dL (ref 13.0–17.0)
LYMPHS ABS: 0.8 10*3/uL (ref 0.7–4.0)
LYMPHS PCT: 9 % — AB (ref 12–46)
MCH: 28.2 pg (ref 26.0–34.0)
MCHC: 32.1 g/dL (ref 30.0–36.0)
MCV: 87.8 fL (ref 78.0–100.0)
MONO ABS: 0.8 10*3/uL (ref 0.1–1.0)
Monocytes Relative: 8 % (ref 3–12)
NEUTROS ABS: 7.7 10*3/uL (ref 1.7–7.7)
Neutrophils Relative %: 82 % — ABNORMAL HIGH (ref 43–77)
Platelets: 183 10*3/uL (ref 150–400)
RBC: 5.39 MIL/uL (ref 4.22–5.81)
RDW: 14.7 % (ref 11.5–15.5)
WBC: 9.4 10*3/uL (ref 4.0–10.5)

## 2013-11-28 LAB — LIPASE, BLOOD: Lipase: 44 U/L (ref 11–59)

## 2013-11-28 MED ORDER — METRONIDAZOLE IN NACL 5-0.79 MG/ML-% IV SOLN
500.0000 mg | Freq: Once | INTRAVENOUS | Status: AC
  Administered 2013-11-28: 500 mg via INTRAVENOUS
  Filled 2013-11-28: qty 100

## 2013-11-28 MED ORDER — SODIUM CHLORIDE 0.9 % IV BOLUS (SEPSIS)
1000.0000 mL | Freq: Once | INTRAVENOUS | Status: AC
  Administered 2013-11-28: 1000 mL via INTRAVENOUS

## 2013-11-28 MED ORDER — ONDANSETRON HCL 4 MG/2ML IJ SOLN
4.0000 mg | Freq: Four times a day (QID) | INTRAMUSCULAR | Status: DC | PRN
  Administered 2013-11-28: 4 mg via INTRAVENOUS
  Filled 2013-11-28: qty 2

## 2013-11-28 MED ORDER — CIPROFLOXACIN IN D5W 400 MG/200ML IV SOLN
400.0000 mg | Freq: Once | INTRAVENOUS | Status: AC
  Administered 2013-11-28: 400 mg via INTRAVENOUS
  Filled 2013-11-28: qty 200

## 2013-11-28 MED ORDER — ONDANSETRON HCL 4 MG/2ML IJ SOLN
4.0000 mg | Freq: Once | INTRAMUSCULAR | Status: AC
  Administered 2013-11-28: 4 mg via INTRAVENOUS
  Filled 2013-11-28: qty 2

## 2013-11-28 MED ORDER — HYDROMORPHONE HCL 1 MG/ML IJ SOLN
1.0000 mg | Freq: Once | INTRAMUSCULAR | Status: AC
  Administered 2013-11-28: 1 mg via INTRAVENOUS
  Filled 2013-11-28: qty 1

## 2013-11-28 MED ORDER — PANTOPRAZOLE SODIUM 20 MG PO TBEC
20.0000 mg | DELAYED_RELEASE_TABLET | Freq: Every day | ORAL | Status: DC | PRN
  Filled 2013-11-28: qty 1

## 2013-11-28 MED ORDER — MORPHINE SULFATE 2 MG/ML IJ SOLN: 1.0000 mg | INTRAMUSCULAR | Status: DC | PRN

## 2013-11-28 MED ORDER — KETOROLAC TROMETHAMINE 30 MG/ML IJ SOLN
30.0000 mg | Freq: Once | INTRAMUSCULAR | Status: AC
  Administered 2013-11-28: 30 mg via INTRAVENOUS
  Filled 2013-11-28: qty 1

## 2013-11-28 MED ORDER — ONDANSETRON HCL 4 MG PO TABS: 4.0000 mg | ORAL_TABLET | Freq: Four times a day (QID) | ORAL | Status: DC | PRN

## 2013-11-28 MED ORDER — HEPARIN SODIUM (PORCINE) 5000 UNIT/ML IJ SOLN
5000.0000 [IU] | Freq: Three times a day (TID) | INTRAMUSCULAR | Status: DC
  Administered 2013-11-28 (×2): 5000 [IU] via SUBCUTANEOUS
  Filled 2013-11-28 (×6): qty 1

## 2013-11-28 MED ORDER — PROMETHAZINE HCL 25 MG/ML IJ SOLN
25.0000 mg | Freq: Once | INTRAMUSCULAR | Status: AC
  Administered 2013-11-28: 25 mg via INTRAVENOUS
  Filled 2013-11-28: qty 1

## 2013-11-28 MED ORDER — OXYCODONE-ACETAMINOPHEN 5-325 MG PO TABS: 1.0000 | ORAL_TABLET | ORAL | Status: DC | PRN

## 2013-11-28 NOTE — ED Notes (Signed)
Pt transported to floor with NT, Pt NAD.

## 2013-11-28 NOTE — ED Notes (Signed)
Pt began vomiting during triage

## 2013-11-28 NOTE — Care Management Note (Signed)
CARE MANAGEMENT NOTE 11/28/2013  Patient:  Eric Winters, Eric Winters   Account Number:  [id number]  Date Initiated:  11/28/2013  Documentation initiated by:  Marney Doctor  Subjective/Objective Assessment:   43 yo male admitted with vomitting.  Hx of intra-abdominal lymphoma.     Action/Plan:   From home with spouse.   Anticipated DC Date:  12/02/2013   Anticipated DC Plan:  [place]  CM consult      Choice offered to / List presented to:             Status of service:  In process, will continue to follow Medicare Important Message given?   (If response is "NO", the following Medicare IM given date fields will be blank) Date Medicare IM given:   Medicare IM given by:   Date Additional Medicare IM given:   Additional Medicare IM given by:    Discharge Disposition:    Per UR Regulation:  Reviewed for med. necessity/level of care/duration of stay  If discussed at [place] of Stay Meetings, dates discussed:    Comments:  11/28/13 Marney Doctor RN,BSN,NCM Chart reviewed and CM following for DC needs.

## 2013-11-28 NOTE — ED Notes (Signed)
Returned from CT.

## 2013-11-28 NOTE — ED Provider Notes (Signed)
620 case d/w Dr. Doyle Askew, observation med surg team 8  Maelynn Moroney Alfonso Patten, MD 11/28/13 [phone]

## 2013-11-28 NOTE — ED Notes (Signed)
Pt is c/o upper abd pain and continues to feel nauseated  EDP notified  Orders received

## 2013-11-28 NOTE — ED Notes (Signed)
Pt actively vomiting in room  Orders received

## 2013-11-28 NOTE — ED Notes (Signed)
Pt got up to the restroom and states he had a small BM

## 2013-11-28 NOTE — ED Notes (Signed)
Patient transported to CT 

## 2013-11-28 NOTE — ED Provider Notes (Addendum)
CSN: [id number]     Arrival date & time 11/28/13  0021 History   First MD Initiated Contact with Patient 11/28/13 0044     Chief Complaint  Patient presents with  . Abdominal Pain     (Consider location/radiation/quality/duration/timing/severity/associated sxs/prior Treatment) Patient is a 43 y.o. male presenting with abdominal pain. The history is provided by the patient.  Abdominal Pain Pain location:  RLQ Pain quality: cramping   Pain radiates to:  Does not radiate Pain severity:  Severe Onset quality:  Gradual Timing:  Intermittent Progression:  Unchanged Chronicity:  Recurrent Context: not alcohol use and not awakening from sleep   Associated symptoms: nausea and vomiting   Associated symptoms: no constipation and no diarrhea     Past Medical History  Diagnosis Date  . IBS (irritable bowel syndrome)   . Crohn disease   . Non Hodgkin's lymphoma    Past Surgical History  Procedure Laterality Date  . Eye surgery    . Exploratory laparotomy with abdominal mass excision  2003    Exploratory laparotomy with excisional biopsy of mesenteric lymph   No family history on file. History  Substance Use Topics  . Smoking status: Never Smoker   . Smokeless tobacco: Not on file  . Alcohol Use: Yes     Comment: rarely    Review of Systems  Gastrointestinal: Positive for nausea, vomiting and abdominal pain. Negative for diarrhea and constipation.  All other systems reviewed and are negative.     Allergies  Penicillins and Latex  Home Medications   Prior to Admission medications   Medication Sig Start Date End Date Taking? Authorizing Provider  Light Mineral Oil-Mineral Oil (RETAINE MGD) 0.5-0.5 % EMUL Apply 1 drop to eye as needed (for dry eyes).   Yes Historical Provider, MD  Multiple Vitamins-Minerals (DAILY PAK MAXIMUM MULTIVITAMIN PO) Take 1 packet by mouth 2 (two) times daily. ID Life Vitamin Pack ([place]/vitamin D/Omega-3/Probiotics/Multivitamin)   Yes  Historical Provider, MD  oxyCODONE-acetaminophen (PERCOCET/ROXICET) 5-325 MG per tablet Take 1 tablet by mouth every 4 (four) hours as needed. pain   Yes Historical Provider, MD  pantoprazole (PROTONIX) 20 MG tablet Take 20 mg by mouth daily as needed for heartburn.    Yes Historical Provider, MD   BP 109/62  Pulse 66  Temp(Src) 98.1 F (36.7 C) (Oral)  Resp 14  SpO2 96% Physical Exam  Constitutional: He is oriented to person, place, and time. He appears well-developed and well-nourished. No distress.  HENT:  Head: Normocephalic and atraumatic.  Mouth/Throat: Oropharynx is clear and moist.  Eyes: Conjunctivae are normal. Pupils are equal, round, and reactive to light.  Neck: Normal range of motion. Neck supple.  Cardiovascular: Normal rate, regular rhythm and intact distal pulses.   Pulmonary/Chest: Effort normal and breath sounds normal. He has no wheezes. He has no rales.  Abdominal: Soft. Bowel sounds are normal. He exhibits no distension and no mass. There is no tenderness. There is no rebound and no guarding.  Musculoskeletal: Normal range of motion.  Neurological: He is alert and oriented to person, place, and time.  Skin: Skin is warm and dry.  Psychiatric: He has a normal mood and affect.    ED Course  Procedures (including critical care time) Labs Review Labs Reviewed  CBC WITH DIFFERENTIAL - Abnormal; Notable for the following:    Neutrophils Relative % 82 (*)    Lymphocytes Relative 9 (*)    All other components within normal limits  URINALYSIS, ROUTINE W REFLEX  MICROSCOPIC - Abnormal; Notable for the following:    Ketones, ur 15 (*)    All other components within normal limits  I-STAT CHEM 8, ED - Abnormal; Notable for the following:    Glucose, Bld 119 (*)    Hemoglobin 17.3 (*)    All other components within normal limits  LIPASE, BLOOD  HEPATIC FUNCTION PANEL    Imaging Review Ct Abdomen Pelvis Wo Contrast  11/28/2013   CLINICAL DATA:  Mid abdominal pain  and nausea and vomiting since afternoon of 11/1913. History of non-Hodgkin's lymphoma. History of Crohn's disease.  EXAM: CT ABDOMEN AND PELVIS WITHOUT CONTRAST  TECHNIQUE: Multidetector CT imaging of the abdomen and pelvis was performed following the standard protocol without IV contrast.  COMPARISON:  02/20/2012  FINDINGS: Mild dependent atelectasis in the lung bases.  The unenhanced appearance of the liver, spleen, gallbladder, pancreas, adrenal glands, kidneys, abdominal aorta, inferior vena cava is unremarkable. Mild prominence of retroperitoneal lymph nodes with periaortic nodes measuring up to about 14 mm and scattered mildly enlarged mesenteric lymph nodes measuring up to about 10 mm diameter. Infiltration in the mesenteric fat. These changes are decreased since previous study and are compatible with the known history of lymphoma. Surgical clips in the mesentery. Stomach and small bowel are decompressed. No free air or free fluid in the abdomen. Liquid stool in the colon without distention. The terminal ileum demonstrates wall thickening over a fairly long segment. This most likely relates to history of the Crohn's disease. No change since previous study.  Pelvis: Prostate gland is not enlarged. No pelvic mass or lymphadenopathy. No evidence of diverticulitis. Bladder wall is not thickened. Appendix is normal. No destructive bone lesions.  IMPRESSION: Infiltration throughout the mesentery with mildly enlarged retroperitoneal and mesenteric lymph nodes. These changes are not decreased since previous study and are consistent with history of lymphoma. Wall thickening throughout the terminal ileum without proximal obstruction likely relates to history of Crohn's disease. This is also unchanged since prior study.   Electronically Signed   By: Lucienne Capers M.D.   On: 11/28/2013 04:07   Dg Abd Acute W/chest  11/28/2013   CLINICAL DATA:  Mid abdominal pain since late afternoon 11/27/2013. Nausea. History of  small bowel obstructions. History of lymphoma.  EXAM: ACUTE ABDOMEN SERIES (ABDOMEN 2 VIEW & CHEST 1 VIEW)  COMPARISON:  02/20/2012  FINDINGS: Infuse-A-Port with tip over the mid SVC region. Shallow inspiration. Normal heart size and pulmonary vascularity. No focal airspace disease or consolidation in the lungs.  Gas and stool scattered throughout the colon. No small or large bowel distention. No free intra-abdominal air. No abnormal air-fluid levels. No radiopaque stones. Surgical clips in the left abdomen.  IMPRESSION: No evidence of active pulmonary disease. Nonobstructive bowel gas pattern.   Electronically Signed   By: Lucienne Capers M.D.   On: 11/28/2013 01:59     EKG Interpretation None      MDM   Final diagnoses:  None   Patient reports cannot be on treatment for crohns due to NHL.    Have attempted hydration and PO challenged and has failed.  Will admit to medicine   Medications  sodium chloride 0.9 % bolus 1,000 mL (not administered)  ciprofloxacin (CIPRO) IVPB 400 mg (not administered)  metroNIDAZOLE (FLAGYL) IVPB 500 mg (not administered)  HYDROmorphone (DILAUDID) injection 1 mg (1 mg Intravenous Given 11/28/13 0127)  sodium chloride 0.9 % bolus 1,000 mL (1,000 mLs Intravenous New Bag/Given 11/28/13 0126)  ondansetron (ZOFRAN) injection  4 mg (4 mg Intravenous Given 11/28/13 0126)  ketorolac (TORADOL) 30 MG/ML injection 30 mg (30 mg Intravenous Given 11/28/13 0232)  ondansetron (ZOFRAN) injection 4 mg (4 mg Intravenous Given 11/28/13 0428)  promethazine (PHENERGAN) injection 25 mg (25 mg Intravenous Given 11/28/13 0517)  HYDROmorphone (DILAUDID) injection 1 mg (1 mg Intravenous Given 11/28/13 0611)     Shawnia Vizcarrondo K Gabrille Kilbride-Rasch, MD 11/28/13 [phone]

## 2013-11-28 NOTE — ED Notes (Signed)
Pt requested ice chips, Pt was informed he could not have ice chips at this time. Will offer fluids when Pt is no longer NPO.

## 2013-11-28 NOTE — H&P (Addendum)
[facility] History and Physical  Eric Winters GGE:[id number] DOB: [birth year] DOA: 11/28/2013  Referring physician: Dr. Randal Winters PCP: Eric Riches, MD   Chief Complaint: Abdominal discomfort  HPI: Eric Winters is a 43 y.o. male  With history of non-Hodgkin's lymphoma and suspected Crohn's disease. The patient presents with one-day complaint of abdominal discomfort. He states he has had this on and off of which resolves with bowel rest. He states he traveled to the [place] in September. He denies any fevers or sick contacts. Also endorses vomiting. The pain is at the upper quadrant of his abdomen and does not radiate anywhere else. Nothing he is aware of makes it better at this moment besides pain medication he received in the ED. Eating, shifting position, drinking fluids makes his abdominal discomfort worse.  The patient states he was following up with Eric Winters but admits he did not follow up because he was feeling better has not seen him for approximately one year. Given persistent abdominal discomfort and emesis we were consulted for further evaluation and recommendations.   Review of Systems:  Constitutional:  No weight loss, night sweats, Fevers, chills, fatigue.  HEENT:  No headaches, Difficulty swallowing,Tooth/dental problems,Sore throat,  No sneezing, itching, ear ache, nasal congestion, post nasal drip,  Cardio-vascular:  No chest pain, Orthopnea, PND, swelling in lower extremities, anasarca, dizziness, palpitations  GI:  No heartburn, indigestion, + abdominal pain, nausea, + vomiting, diarrhea, change in bowel habits, loss of appetite  Resp:  No shortness of breath with exertion or at rest. No excess mucus, no productive cough, No non-productive cough, No coughing up of blood.No change in color of mucus.No wheezing.No chest wall deformity  Skin:  no rash or lesions.  GU:  no dysuria, change in color of urine, no urgency or frequency. No flank pain.    Musculoskeletal:  No joint pain or swelling. No decreased range of motion. No back pain.  Psych:  No change in mood or affect. No depression or anxiety. No memory loss.   Past Medical History  Diagnosis Date  . IBS (irritable bowel syndrome)   . Crohn disease   . Non Hodgkin's lymphoma    Past Surgical History  Procedure Laterality Date  . Eye surgery    . Exploratory laparotomy with abdominal mass excision  2003    Exploratory laparotomy with excisional biopsy of mesenteric lymph   Social History:  reports that he has never smoked. He does not have any smokeless tobacco history on file. He reports that he drinks alcohol. He reports that he does not use illicit drugs.  Allergies  Allergen Reactions  . Penicillins Hives  . Latex Rash    Family history - No history of any family diseases reported to me on questioning.  Prior to Admission medications   Medication Sig Start Date End Date Taking? Authorizing Provider  Light Mineral Oil-Mineral Oil (RETAINE MGD) 0.5-0.5 % EMUL Apply 1 drop to eye as needed (for dry eyes).   Yes Historical Provider, MD  Multiple Vitamins-Minerals (DAILY PAK MAXIMUM MULTIVITAMIN PO) Take 1 packet by mouth 2 (two) times daily. ID Life Vitamin Pack ([place]/vitamin D/Omega-3/Probiotics/Multivitamin)   Yes Historical Provider, MD  oxyCODONE-acetaminophen (PERCOCET/ROXICET) 5-325 MG per tablet Take 1 tablet by mouth every 4 (four) hours as needed. pain   Yes Historical Provider, MD  pantoprazole (PROTONIX) 20 MG tablet Take 20 mg by mouth daily as needed for heartburn.    Yes Historical Provider, MD   Physical Exam: Filed Vitals:  11/28/13 0038 11/28/13 0412 11/28/13 0625 11/28/13 0815  BP: 124/84 109/62 118/74 112/63  Pulse: 79 66 78 77  Temp: 97.7 F (36.5 C) 98.1 F (36.7 C)  98.3 F (36.8 C)  TempSrc: Oral Oral  Oral  Resp: 18 14 16 16   Height:    5' 10.5" (1.791 m)  Weight:    103.2 kg (227 lb 8.2 oz)  SpO2: 100% 96% 91% 96%    Wt  Readings from Last 3 Encounters:  11/28/13 103.2 kg (227 lb 8.2 oz)  02/19/12 89.812 kg (198 lb)  02/18/12 89.812 kg (198 lb)    General:  Appears calm and comfortable Eyes: PERRL, normal lids, irises & conjunctiva ENT: grossly normal hearing, lips & tongue Neck: no LAD, masses or thyromegaly Cardiovascular: RRR, no m/r/g. No LE edema. Respiratory: CTA bilaterally, no w/r/r. Normal respiratory effort. Abdomen: soft, mild tenderness on palpation, nd  Skin: no rash or induration seen on limited exam Musculoskeletal: grossly normal tone BUE/BLE Psychiatric: grossly normal mood and affect, speech fluent and appropriate Neurologic: grossly non-focal.          Labs on Admission:  Basic Metabolic Panel:  Recent Labs Lab 11/28/13 0136  NA 139  K 4.1  CL 102  GLUCOSE 119*  BUN 15  CREATININE 1.30   Liver Function Tests:  Recent Labs Lab 11/28/13 0045  AST 26  ALT 20  ALKPHOS 83  BILITOT 0.9  PROT 7.8  ALBUMIN 4.1    Recent Labs Lab 11/28/13 0045  LIPASE 44   No results found for this basename: AMMONIA,  in the last 168 hours CBC:  Recent Labs Lab 11/28/13 0045 11/28/13 0136  WBC 9.4  --   NEUTROABS 7.7  --   HGB 15.2 17.3*  HCT 47.3 51.0  MCV 87.8  --   PLT 183  --    Cardiac Enzymes: No results found for this basename: CKTOTAL, CKMB, CKMBINDEX, TROPONINI,  in the last 168 hours  BNP (last 3 results) No results found for this basename: PROBNP,  in the last 8760 hours CBG: No results found for this basename: GLUCAP,  in the last 168 hours  Radiological Exams on Admission: Ct Abdomen Pelvis Wo Contrast  11/28/2013   CLINICAL DATA:  Mid abdominal pain and nausea and vomiting since afternoon of 11/1913. History of non-Hodgkin's lymphoma. History of Crohn's disease.  EXAM: CT ABDOMEN AND PELVIS WITHOUT CONTRAST  TECHNIQUE: Multidetector CT imaging of the abdomen and pelvis was performed following the standard protocol without IV contrast.  COMPARISON:   02/20/2012  FINDINGS: Mild dependent atelectasis in the lung bases.  The unenhanced appearance of the liver, spleen, gallbladder, pancreas, adrenal glands, kidneys, abdominal aorta, inferior vena cava is unremarkable. Mild prominence of retroperitoneal lymph nodes with periaortic nodes measuring up to about 14 mm and scattered mildly enlarged mesenteric lymph nodes measuring up to about 10 mm diameter. Infiltration in the mesenteric fat. These changes are decreased since previous study and are compatible with the known history of lymphoma. Surgical clips in the mesentery. Stomach and small bowel are decompressed. No free air or free fluid in the abdomen. Liquid stool in the colon without distention. The terminal ileum demonstrates wall thickening over a fairly long segment. This most likely relates to history of the Crohn's disease. No change since previous study.  Pelvis: Prostate gland is not enlarged. No pelvic mass or lymphadenopathy. No evidence of diverticulitis. Bladder wall is not thickened. Appendix is normal. No destructive bone lesions.  IMPRESSION:  Infiltration throughout the mesentery with mildly enlarged retroperitoneal and mesenteric lymph nodes. These changes are not decreased since previous study and are consistent with history of lymphoma. Wall thickening throughout the terminal ileum without proximal obstruction likely relates to history of Crohn's disease. This is also unchanged since prior study.   Electronically Signed   By: Lucienne Capers M.D.   On: 11/28/2013 04:07   Dg Abd Acute W/chest  11/28/2013   CLINICAL DATA:  Mid abdominal pain since late afternoon 11/27/2013. Nausea. History of small bowel obstructions. History of lymphoma.  EXAM: ACUTE ABDOMEN SERIES (ABDOMEN 2 VIEW & CHEST 1 VIEW)  COMPARISON:  02/20/2012  FINDINGS: Infuse-A-Port with tip over the mid SVC region. Shallow inspiration. Normal heart size and pulmonary vascularity. No focal airspace disease or consolidation in  the lungs.  Gas and stool scattered throughout the colon. No small or large bowel distention. No free intra-abdominal air. No abnormal air-fluid levels. No radiopaque stones. Surgical clips in the left abdomen.  IMPRESSION: No evidence of active pulmonary disease. Nonobstructive bowel gas pattern.   Electronically Signed   By: Lucienne Capers M.D.   On: 11/28/2013 01:59    Assessment/Plan Active Problems:   Vomiting - Provide supportive therapy - Etiology uncertain    Abdominal discomfort - Etiology unknown - Pt lost to f/u with Gastroenterologist (Eric Winters), consulted GI to get recommendations. - supportive therapy.  Non hodgkins lymphoma - Pt to f/u with oncologist after discharge.  Code Status: full DVT Prophylaxis: heparin Family Communication: None at bedside Disposition Plan:   Time spent: > 45 minutes  Velvet Bathe [facility] Pager [phone]

## 2013-11-28 NOTE — ED Notes (Signed)
Pt sleeping and oxygen sats ranging from 88-91% on room air  Pt placed on 2 liters of oxygen via [place] to maintain sats

## 2013-11-28 NOTE — Consult Note (Signed)
Referring Provider: Dr. Mart Piggs Primary Care Physician:  [name], MD Primary Gastroenterologist:  Dr. [name]  Reason for Consultation:  Possible recurrent small bowel obstruction  HPI: Eric Winters is a 43 y.o. male  admitted to the emergency room early this morning, following a roughly 6 hour history of recurrent crampy colicky midabdominal pain which culminated in recurrent nonbloody emesis.   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 However, he is clinically in remission with respect to the lymphoma, with reduction in the size of his intra-abdominal adenopathy.   The patient has had a history of recurring small bowel obstructions, requiring 4 brief hospitalizations, some with NG decompression, in February 2010, May 2010, June 2012, and most recently in January 2014. He has not had any bowel obstruction or obstructive symptomatology for the past year and a half, until yesterday afternoon.   He did have a minor GI bleed episode characterized by melenic stools and a minor drop in hemoglobin while on Aleve in May 2014, but that was never evaluated endoscopically and resolved on its own.  Since coming into the hospital this morning, the patient has had multiple watery bowel movements, and some additional vomiting, and now feels much better. He says it is as though "somebody flipped to switch" and he could feel intestinal contents going downstream again, with a lot of gurgling  sensation. His CT scan obtained in the emergency room did not show any evidence of obstruction, but it was obtained after some of his vomiting. At this point, he is pain free and actually feels hungry and would like to eat.  Past Medical History  Diagnosis Date  . IBS (irritable bowel syndrome)   . Crohn disease   . Non Hodgkin's lymphoma     Past Surgical History  Procedure Laterality Date  . Eye surgery    . Exploratory laparotomy with abdominal mass excision  2003    Exploratory laparotomy with excisional biopsy of mesenteric lymph    Prior to Admission medications   Medication Sig Start Date End Date Taking? Authorizing Provider  Light Mineral Oil-Mineral Oil (RETAINE MGD) 0.5-0.5 % EMUL Apply 1 drop to eye as needed (for dry eyes).   Yes Historical Provider, MD  Multiple Vitamins-Minerals (DAILY PAK MAXIMUM MULTIVITAMIN PO) Take 1 packet by mouth 2 (two) times daily. ID Life Vitamin Pack ([place]/vitamin D/Omega-3/Probiotics/Multivitamin)   Yes Historical Provider, MD  oxyCODONE-acetaminophen (PERCOCET/ROXICET) 5-325 MG per tablet Take 1 tablet by mouth every 4 (four) hours as needed. pain   Yes Historical Provider, MD  pantoprazole (PROTONIX) 20 MG tablet Take 20 mg by mouth daily as needed for heartburn.    Yes Historical Provider, MD    Current Facility-Administered Medications  Medication Dose Route Frequency Provider Last Rate Last Dose  . heparin injection 5,000 Units  5,000 Units Subcutaneous 3 times per day Velvet Bathe, MD      . morphine 2 MG/ML injection 1 mg  1 mg Intravenous Q3H PRN Velvet Bathe, MD      .  ondansetron (ZOFRAN) tablet 4 mg  4 mg Oral Q6H PRN Velvet Bathe, MD       Or  . ondansetron (ZOFRAN) injection 4 mg  4 mg Intravenous Q6H PRN Velvet Bathe, MD      . oxyCODONE-acetaminophen (PERCOCET/ROXICET) 5-325 MG per tablet 1 tablet  1 tablet Oral Q4H PRN Velvet Bathe, MD      . pantoprazole (PROTONIX) EC tablet 20 mg  20 mg Oral Daily PRN Velvet Bathe, MD         Allergies as of 11/28/2013 - Review Complete 11/28/2013  Allergen Reaction Noted  . Penicillins Hives 08/15/2011  . Latex Rash 08/15/2011    History reviewed. No pertinent family history.  History   Social History  . Marital Status: Married    Spouse Name: N/A    Number of Children: N/A  . Years of Education: N/A   Occupational History  . Not on file.   Social History Main Topics  . Smoking status: Never Smoker   . Smokeless tobacco: Never Used  . Alcohol Use: Yes     Comment: rarely  . Drug Use: No  . Sexual Activity: Not on file   Other Topics Concern  . Not on file   Social History Narrative  . No narrative on file    Review of Systems: Generally negative. No anorexia, 30 pound waking in the past year, rare reflux symptoms after eating spicy foods, no dysphagia (has prior history of esophageal ring an endoscopy), no chronic nausea or abdominal pain, typically has 5 putting like soft bowel movements per day, sometimes more depending on what he eats. No rectal bleeding apart from a tiny drop of blood in the toilet occasionally if he has had multiple bowel movements in a given today, and feels perianal irritation. No chest pain, palpitations, syncopal spells, shortness of breath, chronic cough, joint effusions, palpable lymph nodes, skin problems, or urinary difficulties.  Physical Exam: Vital signs in last 24 hours: Temp:  [97.7 F (36.5 C)-98.3 F (36.8 C)] 98.3 F (36.8 C) (10/20 0815) Pulse Rate:  [66-79] 77 (10/20 0815) Resp:  [14-18] 16 (10/20 0815) BP: (109-124)/(62-84) 112/63 mmHg (10/20 0815) SpO2:  [91 %-100 %] 96 % (10/20 0815) Weight:  [103.2 kg (227 lb 8.2 oz)] 103.2 kg (227 lb 8.2 oz) (10/20 0815)   General:   Alert,  Well-developed, well-nourished, pleasant and cooperative in NAD Head:  Normocephalic and atraumatic. Eyes:  Sclera clear, no icterus.   Conjunctiva pink. Mouth:   No ulcerations or lesions.  Oropharynx pink but dry. Neck:   No  masses or thyromegaly. Lungs:  Clear throughout to auscultation.   No wheezes, crackles, or rhonchi. No evident respiratory distress. Heart:   Regular rate and rhythm; no murmurs, clicks, rubs,  or gallops. Abdomen:  The abdomen is somewhat obese but not noticeably distended. There is no significant tympany. Bowel sounds are active, somewhat gurgling in character, but without really any obstructive character. No organomegaly, guarding, mass effect, or tenderness are evident. Msk:   Symmetrical without gross deformities. Extremities:   Without clubbing, cyanosis, or edema. Neurologic:  Alert and coherent;  grossly normal neurologically. Skin:  Intact without significant lesions or rashes. Warm and dry. Cervical Nodes:  No significant cervical adenopathy. Psych:   Alert and cooperative. Normal mood and affect.  Intake/Output from previous day:   Intake/Output this shift:    Lab Results:  Recent Labs  11/28/13 0045 11/28/13 0136  WBC 9.4  --   HGB 15.2  17.3*  HCT 47.3 51.0  PLT 183  --    BMET  Recent Labs  11/28/13 0136  NA 139  K 4.1  CL 102  GLUCOSE 119*  BUN 15  CREATININE 1.30   LFT  Recent Labs  11/28/13 0045  PROT 7.8  ALBUMIN 4.1  AST 26  ALT 20  ALKPHOS 83  BILITOT 0.9  BILIDIR <0.2  IBILI NOT CALCULATED   PT/INR No results found for this basename: LABPROT, INR,  in the last 72 hours  Studies/Results: Ct Abdomen Pelvis Wo Contrast  11/28/2013   CLINICAL DATA:  Mid abdominal pain and nausea and vomiting since afternoon of 11/1913. History of non-Hodgkin's lymphoma. History of Crohn's disease.  EXAM: CT ABDOMEN AND PELVIS WITHOUT CONTRAST  TECHNIQUE: Multidetector CT imaging of the abdomen and pelvis was performed following the standard protocol without IV contrast.  COMPARISON:  02/20/2012  FINDINGS: Mild dependent atelectasis in the lung bases.  The unenhanced appearance of the liver, spleen, gallbladder, pancreas, adrenal glands, kidneys, abdominal  aorta, inferior vena cava is unremarkable. Mild prominence of retroperitoneal lymph nodes with periaortic nodes measuring up to about 14 mm and scattered mildly enlarged mesenteric lymph nodes measuring up to about 10 mm diameter. Infiltration in the mesenteric fat. These changes are decreased since previous study and are compatible with the known history of lymphoma. Surgical clips in the mesentery. Stomach and small bowel are decompressed. No free air or free fluid in the abdomen. Liquid stool in the colon without distention. The terminal ileum demonstrates wall thickening over a fairly long segment. This most likely relates to history of the Crohn's disease. No change since previous study.  Pelvis: Prostate gland is not enlarged. No pelvic mass or lymphadenopathy. No evidence of diverticulitis. Bladder wall is not thickened. Appendix is normal. No destructive bone lesions.  IMPRESSION: Infiltration throughout the mesentery with mildly enlarged retroperitoneal and mesenteric lymph nodes. These changes are not decreased since previous study and are consistent with history of lymphoma. Wall thickening throughout the terminal ileum without proximal obstruction likely relates to history of Crohn's disease. This is also unchanged since prior study.   Electronically Signed   By: Lucienne Capers M.D.   On: 11/28/2013 04:07   Dg Abd Acute W/chest  11/28/2013   CLINICAL DATA:  Mid abdominal pain since late afternoon 11/27/2013. Nausea. History of small bowel obstructions. History of lymphoma.  EXAM: ACUTE ABDOMEN SERIES (ABDOMEN 2 VIEW & CHEST 1 VIEW)  COMPARISON:  02/20/2012  FINDINGS: Infuse-A-Port with tip over the mid SVC region. Shallow inspiration. Normal heart size and pulmonary vascularity. No focal airspace disease or consolidation in the lungs.  Gas and stool scattered throughout the colon. No small or large bowel distention. No free intra-abdominal air. No abnormal air-fluid levels. No radiopaque stones.  Surgical clips in the left abdomen.  IMPRESSION: No evidence of active pulmonary disease. Nonobstructive bowel gas pattern.   Electronically Signed   By: Lucienne Capers M.D.   On: 11/28/2013 01:59    Impression: 1. Transient nausea and vomiting, most likely a mild, transient bowel obstruction which has spontaneously resolved based on both radiographic findings in current symptoms. In the past, on at least one occasion the transition point appeared to be in the small bowel, raising the question of adhesions rather than his terminal ileal narrowing as the source of the obstruction on that occasion.  2. History of ileal thickening attributed to chronic Crohn's ileitis with benign clinical course not requiring ongoing therapy.  3. History of intra-abdominal lymphoma status post therapy about a year ago  4. Previous history of recurrent small bowel obstructions  DISCUSSION;   it appears that the current attack has resolved on its own   Plan: observation for now. I do not think that the patient needs NG decompression, either by his CT scan or by his symptoms. I feel he is okay to have "sips and chips" and to probably be advanced to a full liquid diet this evening. If that is tolerated, perhaps he could have a low residue diet tomorrow morning and be discharged thereafter if that is tolerated for several hours.    LOS: 0 days   Minnetta Sandora,Nishant V  11/28/2013, 10:35 AM

## 2013-11-28 NOTE — ED Notes (Signed)
Pt states that he began to have abd pain around 7pm tonight; pt states that he has a hx of SBO and that this feels similar; pt c/o Nausea; no vomiting at this point; pt states that the pain is sharp stabbing

## 2013-11-29 DIAGNOSIS — R1110 Vomiting, unspecified: Secondary | ICD-10-CM

## 2013-11-29 DIAGNOSIS — K5090 Crohn's disease, unspecified, without complications: Secondary | ICD-10-CM

## 2013-11-29 LAB — BASIC METABOLIC PANEL
ANION GAP: 9 (ref 5–15)
BUN: 17 mg/dL (ref 6–23)
CALCIUM: 8.3 mg/dL — AB (ref 8.4–10.5)
CO2: 28 meq/L (ref 19–32)
Chloride: 103 mEq/L (ref 96–112)
Creatinine, Ser: 1.41 mg/dL — ABNORMAL HIGH (ref 0.50–1.35)
GFR calc Af Amer: 68 mL/min — ABNORMAL LOW (ref >90)
GFR, EST NON AFRICAN AMERICAN: 59 mL/min — AB (ref >90)
Glucose, Bld: 100 mg/dL — ABNORMAL HIGH (ref 70–99)
Potassium: 4.1 mEq/L (ref 3.7–5.3)
SODIUM: 140 meq/L (ref 137–147)

## 2013-11-29 LAB — CBC
HCT: 38.9 % — ABNORMAL LOW (ref 39.0–52.0)
Hemoglobin: 12.1 g/dL — ABNORMAL LOW (ref 13.0–17.0)
MCH: 27.9 pg (ref 26.0–34.0)
MCHC: 31.1 g/dL (ref 30.0–36.0)
MCV: 89.6 fL (ref 78.0–100.0)
PLATELETS: 135 10*3/uL — AB (ref 150–400)
RBC: 4.34 MIL/uL (ref 4.22–5.81)
RDW: 15 % (ref 11.5–15.5)
WBC: 3.2 10*3/uL — ABNORMAL LOW (ref 4.0–10.5)

## 2013-11-29 NOTE — Discharge Instructions (Signed)

## 2013-11-29 NOTE — Discharge Summary (Signed)
Physician Discharge Summary  Eric Winters SLP:[id number] DOB: [birth year] DOA: 11/28/2013  PCP: [name], MD  Admit date: 11/28/2013 Discharge date: 11/29/2013  Recommendations for Outpatient Follow-up:  1. Pt will follow up with PCP per scheduled appt. He can follow up with GI as needed basis.   Discharge Diagnoses:  Active Problems:   Vomiting   Abdominal discomfort  Discharge Condition: stable   Diet recommendation: as tolerated   History of present illness:  43 y.o. male with history of non- Hodgkin's lymphoma and suspected Crohn's disease who presented with generalized abdominal discomfort over past 24 hours prior to this admission. No reported fevers, chills diarrhea, constipation. No reports of blood in stool or urine.  In ED, vitals were stable. CT abdomen revealed possible Crohn's colitis. All symptoms resolved after couple of hours.    Hospital Course:  Active Problems:   Vomiting / Abdominal discomfort  Transient discomfort less likely Crohn' flare up since all symptoms resolved at this point. Patient feels better this am. No need for antibiotics on discharge and Dr. [name] agrees who recommended follow up with GI as needed.   Acute renal failure  Likely due to dehydration, GI losses.  Pt has received IV fluids. He wants to go home since he feels better. Will have renal function rechecked with PCP.   Leukopenia  No neutropenia. Initial WBC count was WNL and repeat 3.2. May be blood work lab error. Pt insisted on going home and having repeat blood work in PCP office.   Thrombocytopenia  Noted drop in platelet since admission. Likely due to sub Q heparin given on admission.  CBC to be rechecked in PCP office.  SignedLeisa Lenz, MD  [facility] 11/29/2013, 8:53 AM  Pager #: [phone]   Discharge Exam: Filed Vitals:   11/29/13 0700  BP: 94/52  Pulse: 68  Temp: 98.6 F (37 C)  Resp: 20   Filed Vitals:   11/28/13 1320 11/28/13  2143 11/29/13 0217 11/29/13 0700  BP: 114/71 95/54 95/58  94/52  Pulse: 79 68 60 68  Temp: 98.4 F (36.9 C) 98.9 F (37.2 C) 98.2 F (36.8 C) 98.6 F (37 C)  TempSrc: Oral Oral Oral Oral  Resp: 16 16 20 20   Height:      Weight:      SpO2: 95% 95% 97% 95%    General: Pt is alert, follows commands appropriately, not in acute distress Cardiovascular: Regular rate and rhythm, S1/S2 +, no murmurs Respiratory: Clear to auscultation bilaterally, no wheezing, no crackles, no rhonchi Abdominal: Soft, non tender, non distended, bowel sounds +, no guarding Extremities: no edema, no cyanosis, pulses palpable bilaterally DP and PT Neuro: Grossly nonfocal  Discharge Instructions  Discharge Instructions   Call MD for:  difficulty breathing, headache or visual disturbances    Complete by:  As directed      Call MD for:  persistant dizziness or light-headedness    Complete by:  As directed      Call MD for:  persistant nausea and vomiting    Complete by:  As directed      Call MD for:  severe uncontrolled pain    Complete by:  As directed      Diet - low sodium heart healthy    Complete by:  As directed      Increase activity slowly    Complete by:  As directed             Medication List  DAILY PAK MAXIMUM MULTIVITAMIN PO  Take 1 packet by mouth 2 (two) times daily. ID Life Vitamin Pack ([place]/vitamin D/Omega-3/Probiotics/Multivitamin)     oxyCODONE-acetaminophen 5-325 MG per tablet  Commonly known as:  PERCOCET/ROXICET  Take 1 tablet by mouth every 4 (four) hours as needed. pain     pantoprazole 20 MG tablet  Commonly known as:  PROTONIX  Take 20 mg by mouth daily as needed for heartburn.     RETAINE MGD 0.5-0.5 % Emul  Generic drug:  Light Mineral Oil-Mineral Oil  Apply 1 drop to eye as needed (for dry eyes).           Follow-up Information   Follow up with STONE, Jeralyn Ruths, MD. Schedule an appointment as soon as possible for a visit in 2 weeks. (Follow up appt  after recent hospitalization)    Specialty:  Internal Medicine   Contact information:   [address] [place] [address] [phone]        The results of significant diagnostics from this hospitalization (including imaging, microbiology, ancillary and laboratory) are listed below for reference.    Significant Diagnostic Studies: Ct Abdomen Pelvis Wo Contrast 11/28/2013   Infiltration throughout the mesentery with mildly enlarged retroperitoneal and mesenteric lymph nodes. These changes are not decreased since previous study and are consistent with history of lymphoma. Wall thickening throughout the terminal ileum without proximal obstruction likely relates to history of Crohn's disease. This is also unchanged since prior study.     Dg Abd Acute W/chest 11/28/2013  No evidence of active pulmonary disease. Nonobstructive bowel gas pattern.      Microbiology: No results found for this or any previous visit (from the past 240 hour(s)).   Labs: Basic Metabolic Panel:  Recent Labs Lab 11/28/13 0136 11/29/13 0455  NA 139 140  K 4.1 4.1  CL 102 103  CO2  --  28  GLUCOSE 119* 100*  BUN 15 17  CREATININE 1.30 1.41*  CALCIUM  --  8.3*   Liver Function Tests:  Recent Labs Lab 11/28/13 0045  AST 26  ALT 20  ALKPHOS 83  BILITOT 0.9  PROT 7.8  ALBUMIN 4.1    Recent Labs Lab 11/28/13 0045  LIPASE 44   No results found for this basename: AMMONIA,  in the last 168 hours CBC:  Recent Labs Lab 11/28/13 0045 11/28/13 0136 11/29/13 0455  WBC 9.4  --  3.2*  NEUTROABS 7.7  --   --   HGB 15.2 17.3* 12.1*  HCT 47.3 51.0 38.9*  MCV 87.8  --  89.6  PLT 183  --  135*   Cardiac Enzymes: No results found for this basename: CKTOTAL, CKMB, CKMBINDEX, TROPONINI,  in the last 168 hours BNP: BNP (last 3 results) No results found for this basename: PROBNP,  in the last 8760 hours CBG: No results found for this basename: GLUCAP,  in the last 168 hours  Time  coordinating discharge: Over 30 minutes

## 2013-11-29 NOTE — Progress Notes (Signed)
UR completed 

## 2013-11-29 NOTE — Progress Notes (Signed)
Pt discharged to home with wife.  Pt ate breakfast and was monitored after meal.  Pt reports no complaints of discomfort.  Reviewed discharge instructions with patient with no further questions.  Claudette Stapler, RN

## 2013-11-29 NOTE — Progress Notes (Signed)
No pain or further vomiting since yesterday--slt nausea w/ Dilaudid yesterday, none now.  Lots of flatus and liquid BM's last night and this morning.  Hungry, has tolerated full liquid diet last night.  NAD, looks well, "groaning" active BS's (no high-pitched tinkles), slt tympany, mild RUQ tenderness.  IMPR:  Essentially-resolved SBO ?etiol (adhesions vs Crohn's)  RECOMM:   Ok for disch today from my standpoint; I'd feel better if he had at least one solid meal and was observed for a few hours before he actually went out the door.  Call if I can be of further help with this patient.  Cleotis Nipper, M.D. [phone]

## 2014-01-05 ENCOUNTER — Emergency Department (HOSPITAL_COMMUNITY): Payer: 59

## 2014-01-05 ENCOUNTER — Emergency Department (HOSPITAL_COMMUNITY)
Admission: EM | Admit: 2014-01-05 | Discharge: 2014-01-05 | Disposition: A | Discharge status: Home / Self Care | Payer: 59 | Attending: Emergency Medicine | Admitting: Emergency Medicine

## 2014-01-05 ENCOUNTER — Encounter (HOSPITAL_COMMUNITY): Payer: Self-pay

## 2014-01-05 DIAGNOSIS — Z880 Allergy status to penicillin: Secondary | ICD-10-CM | POA: Diagnosis not present

## 2014-01-05 DIAGNOSIS — Z9889 Other specified postprocedural states: Secondary | ICD-10-CM | POA: Insufficient documentation

## 2014-01-05 DIAGNOSIS — Z91040 Latex allergy status: Secondary | ICD-10-CM | POA: Insufficient documentation

## 2014-01-05 DIAGNOSIS — R109 Unspecified abdominal pain: Secondary | ICD-10-CM | POA: Diagnosis present

## 2014-01-05 DIAGNOSIS — Z79899 Other long term (current) drug therapy: Secondary | ICD-10-CM | POA: Diagnosis not present

## 2014-01-05 DIAGNOSIS — Z8572 Personal history of non-Hodgkin lymphomas: Secondary | ICD-10-CM | POA: Insufficient documentation

## 2014-01-05 DIAGNOSIS — Z8719 Personal history of other diseases of the digestive system: Secondary | ICD-10-CM | POA: Diagnosis not present

## 2014-01-05 DIAGNOSIS — R112 Nausea with vomiting, unspecified: Secondary | ICD-10-CM | POA: Diagnosis not present

## 2014-01-05 DIAGNOSIS — R1031 Right lower quadrant pain: Secondary | ICD-10-CM | POA: Insufficient documentation

## 2014-01-05 LAB — CBC WITH DIFFERENTIAL/PLATELET
Basophils Absolute: 0 10*3/uL (ref 0.0–0.1)
Basophils Relative: 0 % (ref 0–1)
Eosinophils Absolute: 0 10*3/uL (ref 0.0–0.7)
Eosinophils Relative: 0 % (ref 0–5)
HCT: 47.2 % (ref 39.0–52.0)
HEMOGLOBIN: 15.4 g/dL (ref 13.0–17.0)
LYMPHS ABS: 0.5 10*3/uL — AB (ref 0.7–4.0)
Lymphocytes Relative: 5 % — ABNORMAL LOW (ref 12–46)
MCH: 28.5 pg (ref 26.0–34.0)
MCHC: 32.6 g/dL (ref 30.0–36.0)
MCV: 87.2 fL (ref 78.0–100.0)
MONOS PCT: 4 % (ref 3–12)
Monocytes Absolute: 0.4 10*3/uL (ref 0.1–1.0)
NEUTROS ABS: 8.9 10*3/uL — AB (ref 1.7–7.7)
NEUTROS PCT: 91 % — AB (ref 43–77)
PLATELETS: 165 10*3/uL (ref 150–400)
RBC: 5.41 MIL/uL (ref 4.22–5.81)
RDW: 14.2 % (ref 11.5–15.5)
WBC: 9.9 10*3/uL (ref 4.0–10.5)

## 2014-01-05 LAB — COMPREHENSIVE METABOLIC PANEL
ALK PHOS: 88 U/L (ref 39–117)
ALT: 20 U/L (ref 0–53)
ANION GAP: 15 (ref 5–15)
AST: 25 U/L (ref 0–37)
Albumin: 4 g/dL (ref 3.5–5.2)
BILIRUBIN TOTAL: 0.7 mg/dL (ref 0.3–1.2)
BUN: 12 mg/dL (ref 6–23)
CHLORIDE: 98 meq/L (ref 96–112)
CO2: 25 mEq/L (ref 19–32)
Calcium: 10 mg/dL (ref 8.4–10.5)
Creatinine, Ser: 1.15 mg/dL (ref 0.50–1.35)
GFR calc Af Amer: 87 mL/min — ABNORMAL LOW (ref >90)
GFR calc non Af Amer: 75 mL/min — ABNORMAL LOW (ref >90)
GLUCOSE: 123 mg/dL — AB (ref 70–99)
POTASSIUM: 3.9 meq/L (ref 3.7–5.3)
SODIUM: 138 meq/L (ref 137–147)
Total Protein: 7.7 g/dL (ref 6.0–8.3)

## 2014-01-05 MED ORDER — MORPHINE SULFATE 4 MG/ML IJ SOLN
6.0000 mg | Freq: Once | INTRAMUSCULAR | Status: AC
  Administered 2014-01-05: 6 mg via INTRAVENOUS
  Filled 2014-01-05: qty 2

## 2014-01-05 MED ORDER — OXYCODONE-ACETAMINOPHEN 5-325 MG PO TABS: 1.0000 | ORAL_TABLET | Freq: Four times a day (QID) | ORAL | Status: DC | PRN

## 2014-01-05 MED ORDER — ONDANSETRON HCL 4 MG PO TABS: 4.0000 mg | ORAL_TABLET | Freq: Four times a day (QID) | ORAL | Status: DC

## 2014-01-05 MED ORDER — ONDANSETRON HCL 4 MG/2ML IJ SOLN
4.0000 mg | Freq: Once | INTRAMUSCULAR | Status: AC
  Administered 2014-01-05: 4 mg via INTRAVENOUS
  Filled 2014-01-05: qty 2

## 2014-01-05 NOTE — ED Notes (Signed)
MD at bedside. 

## 2014-01-05 NOTE — ED Notes (Signed)
Pt states the pressure in his stomach has been relieved and his pain is gone   Pt has bowel sounds noted in all 4 quadrants

## 2014-01-05 NOTE — ED Notes (Signed)
Pt complains of severe abdominal pain, he was admitted three weeks ago for a blockage that resolved itself, he thinks a piece of ham caused this

## 2014-01-05 NOTE — Discharge Instructions (Signed)

## 2014-01-05 NOTE — ED Provider Notes (Signed)
CSN: [id number]     Arrival date & time 01/05/14  0515 History   First MD Initiated Contact with Patient 01/05/14 0541     Chief Complaint  Patient presents with  . Abdominal Pain     (Consider location/radiation/quality/duration/timing/severity/associated sxs/prior Treatment) Patient is a 43 y.o. male presenting with abdominal pain. The history is provided by the patient. No language interpreter was used.  Abdominal Pain Pain location:  LLQ Pain quality: aching   Pain radiates to:  Does not radiate Pain severity:  Severe Onset quality:  Gradual Duration:  10 hours Timing:  Constant Progression:  Worsening Chronicity:  Recurrent Context comment:  Ate a piece of ham of which he thinks got caught in a known stricture.  Relieved by:  Nothing Worsened by:  Nothing tried Ineffective treatments:  None tried Associated symptoms: chills, nausea and vomiting   Associated symptoms: no chest pain, no constipation, no cough, no diarrhea, no dysuria, no fatigue, no fever and no shortness of breath   Risk factors: multiple surgeries, obesity and recent hospitalization   Risk factors comment:  History of Crohn's disease, non-Hodgkin's lymphoma, and irritable bowel syndrome.  Prior exploratory laparoscopy   Past Medical History  Diagnosis Date  . IBS (irritable bowel syndrome)   . Crohn disease   . Non Hodgkin's lymphoma    Past Surgical History  Procedure Laterality Date  . Eye surgery    . Exploratory laparotomy with abdominal mass excision  2003    Exploratory laparotomy with excisional biopsy of mesenteric lymph   History reviewed. No pertinent family history. History  Substance Use Topics  . Smoking status: Never Smoker   . Smokeless tobacco: Never Used  . Alcohol Use: Yes     Comment: rarely    Review of Systems  Constitutional: Positive for chills. Negative for fever, activity change, appetite change and fatigue.  HENT: Negative for congestion, facial swelling,  rhinorrhea and trouble swallowing.   Eyes: Negative for photophobia and pain.  Respiratory: Negative for cough, chest tightness and shortness of breath.   Cardiovascular: Negative for chest pain and leg swelling.  Gastrointestinal: Positive for nausea, vomiting and abdominal pain. Negative for diarrhea and constipation.  Endocrine: Negative for polydipsia and polyuria.  Genitourinary: Negative for dysuria, urgency, decreased urine volume and difficulty urinating.  Musculoskeletal: Negative for back pain and gait problem.  Skin: Negative for color change, rash and wound.  Allergic/Immunologic: Negative for immunocompromised state.  Neurological: Negative for dizziness, facial asymmetry, speech difficulty, weakness, numbness and headaches.  Psychiatric/Behavioral: Negative for confusion, decreased concentration and agitation.      Allergies  Dilaudid; Penicillins; and Latex  Home Medications   Prior to Admission medications   Medication Sig Start Date End Date Taking? Authorizing Provider  Light Mineral Oil-Mineral Oil (RETAINE MGD) 0.5-0.5 % EMUL Apply 1 drop to eye as needed (for dry eyes).   Yes Historical Provider, MD  Multiple Vitamins-Minerals (DAILY PAK MAXIMUM MULTIVITAMIN PO) Take 1 packet by mouth 2 (two) times daily. ID Life Vitamin Pack ([place]/vitamin D/Omega-3/Probiotics/Multivitamin)   Yes Historical Provider, MD  oxyCODONE-acetaminophen (PERCOCET/ROXICET) 5-325 MG per tablet Take 1 tablet by mouth every 4 (four) hours as needed. pain   Yes Historical Provider, MD  pantoprazole (PROTONIX) 20 MG tablet Take 20 mg by mouth daily as needed for heartburn.    Yes Historical Provider, MD  traMADol (ULTRAM) 50 MG tablet Take 50 mg by mouth every 6 (six) hours as needed for moderate pain.   Yes Historical Provider, MD  BP 117/64 mmHg  Pulse 71  Temp(Src) 97.8 F (36.6 C) (Oral)  Resp 18  SpO2 100% Physical Exam  Constitutional: He is oriented to person, place, and time. He  appears well-developed and well-nourished. No distress.  HENT:  Head: Normocephalic and atraumatic.  Mouth/Throat: No oropharyngeal exudate.  Eyes: Pupils are equal, round, and reactive to light.  Neck: Normal range of motion. Neck supple.  Cardiovascular: Normal rate, regular rhythm and normal heart sounds.  Exam reveals no gallop and no friction rub.   No murmur heard. Pulmonary/Chest: Effort normal and breath sounds normal. No respiratory distress. He has no wheezes. He has no rales.  Abdominal: Soft. He exhibits no distension and no mass. Bowel sounds are decreased. There is tenderness in the right lower quadrant. There is no rigidity, no rebound and no guarding.  Musculoskeletal: Normal range of motion. He exhibits no edema or tenderness.  Neurological: He is alert and oriented to person, place, and time.  Skin: Skin is warm and dry.  Psychiatric: He has a normal mood and affect.    ED Course  Procedures (including critical care time) Labs Review Labs Reviewed  CBC WITH DIFFERENTIAL - Abnormal; Notable for the following:    Neutrophils Relative % 91 (*)    Neutro Abs 8.9 (*)    Lymphocytes Relative 5 (*)    Lymphs Abs 0.5 (*)    All other components within normal limits  COMPREHENSIVE METABOLIC PANEL    Imaging Review No results found.   EKG Interpretation None      MDM   Final diagnoses:  Abdominal pain  Nausea & vomiting    Pt is a 42 y.o. male with Pmhx as above who presents with gradual onset left lower quadrant abdominal pain beginning around 7 PM this evening.  Pain is been intermittent, associated with nausea and about 5 episodes of non-D, nonbilious emesis.  He has had chills but no fevers.  He reports about 8 prior episodes of small bowel obstruction including 3 weeks ago.  In chart review he did not in fact have a small bowel traction 3 weeks ago and the last one appears to have been in January.  His symptoms 3 weeks ago resolved quickly after treatment with  pain medication and IV fluids, per chart review.  He does also have a history of IBS as well as Crohn's disease and non-Hodgkin's lymphoma.  On physical exam vital signs are stable.  He is in no acute distress.  He is generalized abdominal tenderness.  Bowel sounds are decreased, though present.  No rebound or guarding.  Given that he has had multiple recent CT scans.  We'll start with an acute abdominal series, and we'll treat pain and nausea.  Patient is feeling much improved.  States he feels like his blockages resolved.  Acute abdominal series with possible low-grade small bowel traction.  Given that he is feeling so much that her we'll do a by mouth challenge.  He understands that if symptoms were to worsen again, he would have to return to the ED.  Pt tolerated PO w/o difficulty, will d/c home.     Ernestina Patches, MD 01/05/14 [phone]

## 2014-06-28 ENCOUNTER — Encounter (INDEPENDENT_AMBULATORY_CARE_PROVIDER_SITE_OTHER): Payer: BLUE CROSS/BLUE SHIELD | Admitting: Ophthalmology

## 2014-06-28 DIAGNOSIS — H33302 Unspecified retinal break, left eye: Secondary | ICD-10-CM

## 2014-06-28 DIAGNOSIS — H4312 Vitreous hemorrhage, left eye: Secondary | ICD-10-CM | POA: Diagnosis not present

## 2014-06-28 DIAGNOSIS — H43813 Vitreous degeneration, bilateral: Secondary | ICD-10-CM | POA: Diagnosis not present

## 2014-07-05 ENCOUNTER — Ambulatory Visit (INDEPENDENT_AMBULATORY_CARE_PROVIDER_SITE_OTHER): Payer: BLUE CROSS/BLUE SHIELD | Admitting: Ophthalmology

## 2014-07-05 DIAGNOSIS — H33302 Unspecified retinal break, left eye: Secondary | ICD-10-CM

## 2014-07-27 ENCOUNTER — Encounter (INDEPENDENT_AMBULATORY_CARE_PROVIDER_SITE_OTHER): Payer: BLUE CROSS/BLUE SHIELD | Admitting: Ophthalmology

## 2014-07-27 DIAGNOSIS — H33302 Unspecified retinal break, left eye: Secondary | ICD-10-CM

## 2014-11-26 ENCOUNTER — Ambulatory Visit (INDEPENDENT_AMBULATORY_CARE_PROVIDER_SITE_OTHER): Payer: BLUE CROSS/BLUE SHIELD | Admitting: Ophthalmology

## 2014-12-10 ENCOUNTER — Ambulatory Visit (INDEPENDENT_AMBULATORY_CARE_PROVIDER_SITE_OTHER): Payer: BLUE CROSS/BLUE SHIELD | Admitting: Ophthalmology

## 2014-12-10 DIAGNOSIS — H33302 Unspecified retinal break, left eye: Secondary | ICD-10-CM | POA: Diagnosis not present

## 2014-12-10 DIAGNOSIS — H2513 Age-related nuclear cataract, bilateral: Secondary | ICD-10-CM | POA: Diagnosis not present

## 2014-12-10 DIAGNOSIS — H43813 Vitreous degeneration, bilateral: Secondary | ICD-10-CM | POA: Diagnosis not present

## 2015-02-09 IMAGING — CR DG ABDOMEN ACUTE W/ 1V CHEST
4 series · 4 of 4 positions shown · non-contrast
Comparison: 11/28/2013

CLINICAL DATA: Abdominal pain and distention.

EXAM:
ACUTE ABDOMEN SERIES (ABDOMEN 2 VIEW & CHEST 1 VIEW)

[w chest pa]
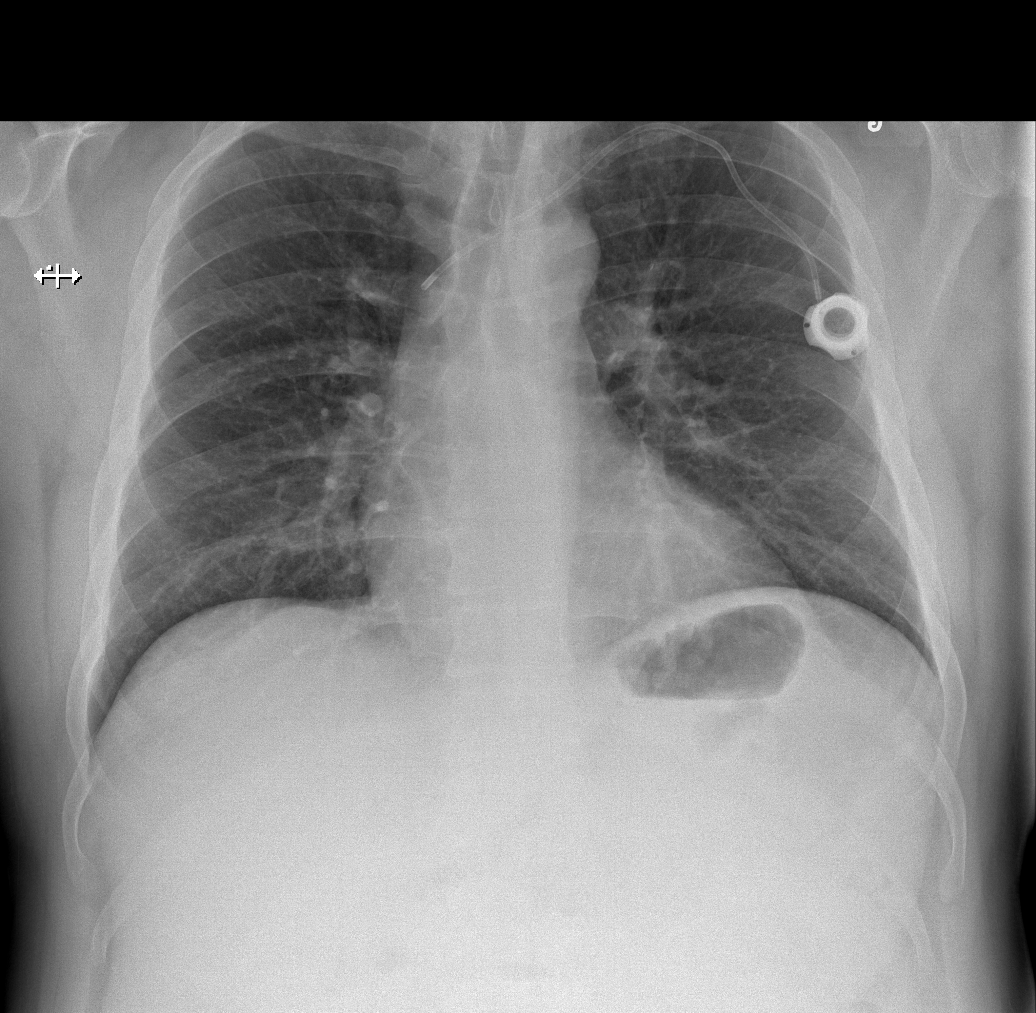

[w abdomen upright]
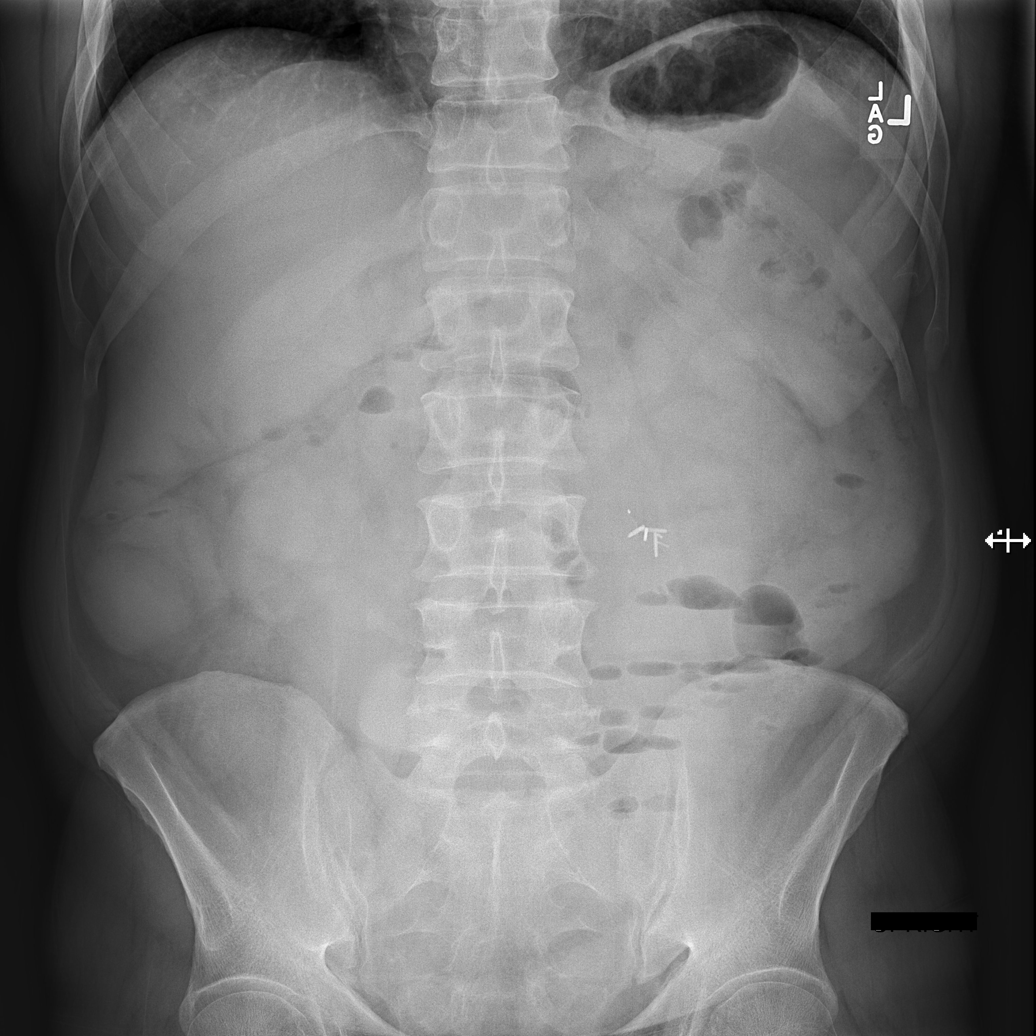

[t abdomen supine (1 of 2)]
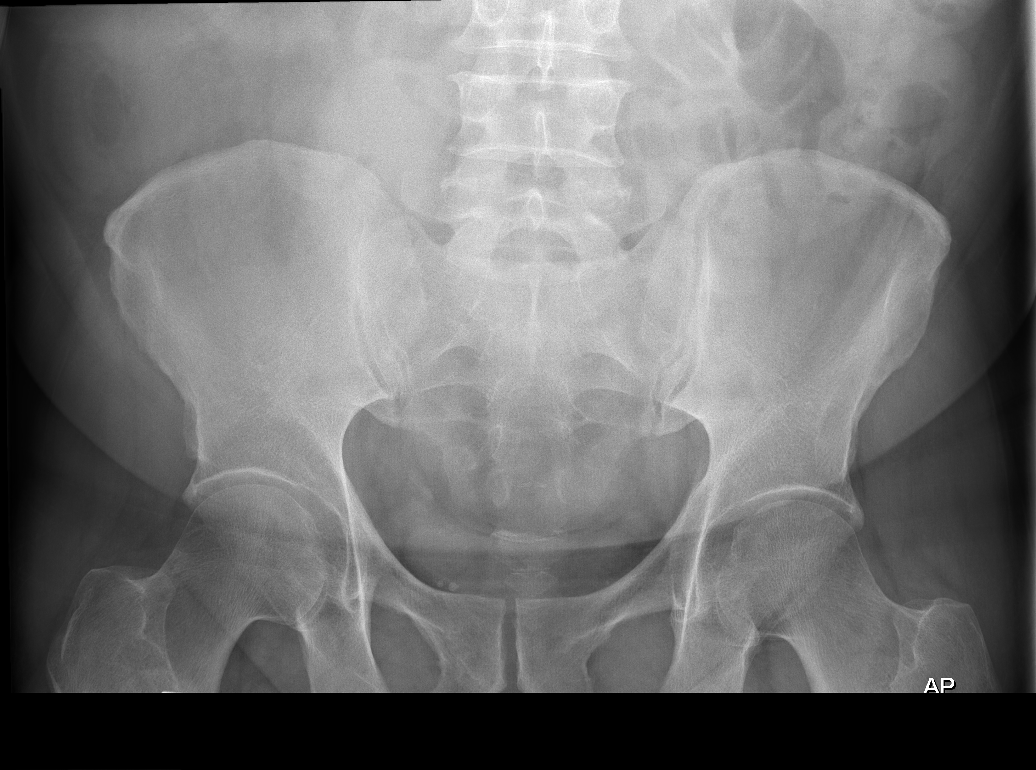

[t abdomen supine (2 of 2)]
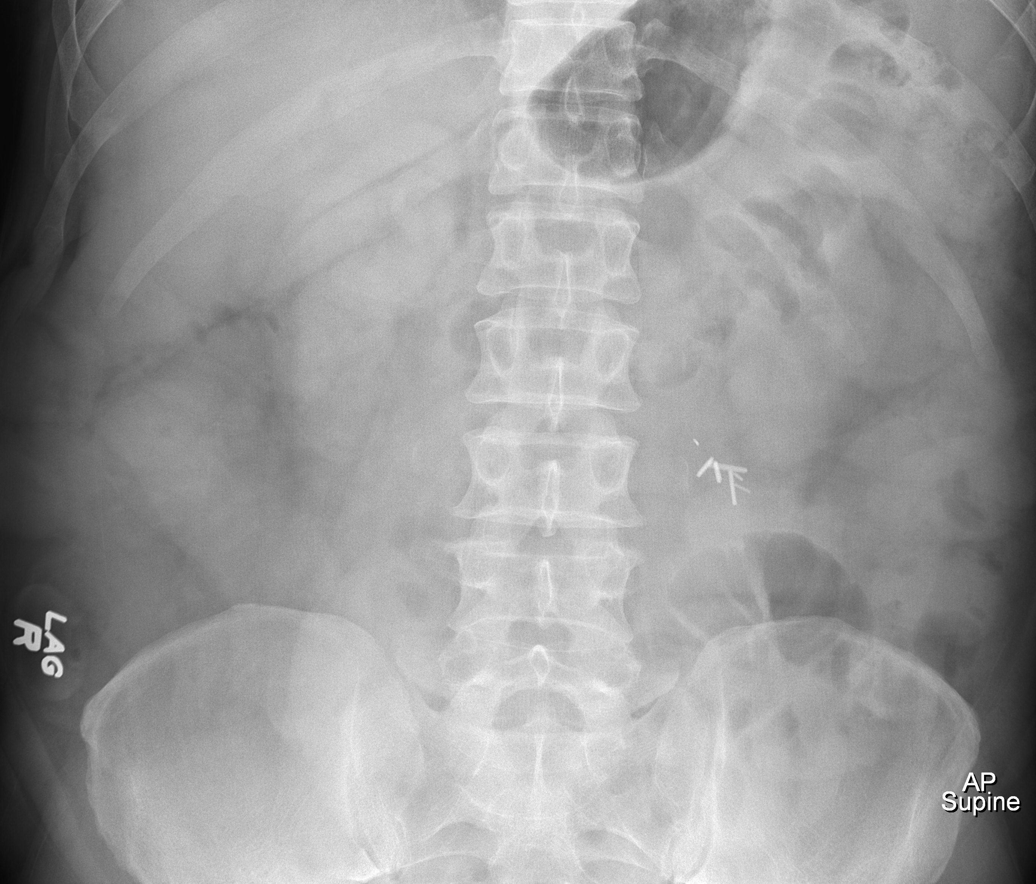

[4 of 4 positions shown; findings below may reference images not displayed]

FINDINGS: A porta catheter from left subclavian approach is in unchanged
position, tip at the level of the azygos.

Normal heart size and mediastinal contours. No acute infiltrate or
edema. No effusion or pneumothorax. No acute osseous findings.

Fluid levels present within the left lower quadrant, where there is
mildly dilated small bowel on supine imaging, measuring up to 4 cm
in diameter. Material is present within the nondilated colon,
suggesting a partial or early bowel obstruction. No
pneumoperitoneum. No concerning intra-abdominal mass effect or
calcification.
IMPRESSION: Abnormal bowel gas pattern, a possible low grade small bowel
obstruction.

## 2016-06-11 ENCOUNTER — Inpatient Hospital Stay (HOSPITAL_COMMUNITY): Payer: Commercial Managed Care - PPO

## 2016-06-11 ENCOUNTER — Emergency Department (HOSPITAL_COMMUNITY): Payer: Commercial Managed Care - PPO

## 2016-06-11 ENCOUNTER — Inpatient Hospital Stay (HOSPITAL_COMMUNITY)
Admission: EM | Admit: 2016-06-11 | Discharge: 2016-06-12 | DRG: 389 | Disposition: A | Discharge status: Home / Self Care | Payer: Commercial Managed Care - PPO | Attending: Internal Medicine | Admitting: Internal Medicine

## 2016-06-11 ENCOUNTER — Encounter (HOSPITAL_COMMUNITY): Payer: Self-pay | Admitting: Emergency Medicine

## 2016-06-11 DIAGNOSIS — K589 Irritable bowel syndrome without diarrhea: Secondary | ICD-10-CM | POA: Diagnosis present

## 2016-06-11 DIAGNOSIS — K56609 Unspecified intestinal obstruction, unspecified as to partial versus complete obstruction: Secondary | ICD-10-CM

## 2016-06-11 DIAGNOSIS — J9382 Other air leak: Secondary | ICD-10-CM

## 2016-06-11 DIAGNOSIS — Z807 Family history of other malignant neoplasms of lymphoid, hematopoietic and related tissues: Secondary | ICD-10-CM | POA: Diagnosis not present

## 2016-06-11 DIAGNOSIS — Z4659 Encounter for fitting and adjustment of other gastrointestinal appliance and device: Secondary | ICD-10-CM

## 2016-06-11 DIAGNOSIS — K565 Intestinal adhesions [bands], unspecified as to partial versus complete obstruction: Secondary | ICD-10-CM | POA: Diagnosis present

## 2016-06-11 DIAGNOSIS — D696 Thrombocytopenia, unspecified: Secondary | ICD-10-CM | POA: Diagnosis present

## 2016-06-11 DIAGNOSIS — K5090 Crohn's disease, unspecified, without complications: Secondary | ICD-10-CM | POA: Diagnosis present

## 2016-06-11 DIAGNOSIS — Z885 Allergy status to narcotic agent status: Secondary | ICD-10-CM

## 2016-06-11 DIAGNOSIS — R112 Nausea with vomiting, unspecified: Secondary | ICD-10-CM | POA: Diagnosis present

## 2016-06-11 DIAGNOSIS — Z91040 Latex allergy status: Secondary | ICD-10-CM | POA: Diagnosis not present

## 2016-06-11 DIAGNOSIS — Z880 Allergy status to penicillin: Secondary | ICD-10-CM | POA: Diagnosis not present

## 2016-06-11 DIAGNOSIS — C8593 Non-Hodgkin lymphoma, unspecified, intra-abdominal lymph nodes: Secondary | ICD-10-CM | POA: Diagnosis present

## 2016-06-11 DIAGNOSIS — Z0189 Encounter for other specified special examinations: Secondary | ICD-10-CM

## 2016-06-11 LAB — COMPREHENSIVE METABOLIC PANEL
ALT: 19 U/L (ref 17–63)
ANION GAP: 12 (ref 5–15)
AST: 24 U/L (ref 15–41)
Albumin: 4.5 g/dL (ref 3.5–5.0)
Alkaline Phosphatase: 82 U/L (ref 38–126)
BUN: 15 mg/dL (ref 6–20)
CALCIUM: 9.5 mg/dL (ref 8.9–10.3)
CO2: 25 mmol/L (ref 22–32)
Chloride: 101 mmol/L (ref 101–111)
Creatinine, Ser: 1.24 mg/dL (ref 0.61–1.24)
GFR calc Af Amer: >60 mL/min (ref >60)
GFR calc non Af Amer: >60 mL/min (ref >60)
GLUCOSE: 112 mg/dL — AB (ref 65–99)
Potassium: 3.7 mmol/L (ref 3.5–5.1)
Sodium: 138 mmol/L (ref 135–145)
Total Bilirubin: 1.3 mg/dL — ABNORMAL HIGH (ref 0.3–1.2)
Total Protein: 7.7 g/dL (ref 6.5–8.1)

## 2016-06-11 LAB — RAPID URINE DRUG SCREEN, HOSP PERFORMED
Amphetamines: NOT DETECTED
BARBITURATES: NOT DETECTED
Benzodiazepines: NOT DETECTED
Cocaine: NOT DETECTED
Opiates: POSITIVE — AB
TETRAHYDROCANNABINOL: NOT DETECTED

## 2016-06-11 LAB — URINALYSIS, ROUTINE W REFLEX MICROSCOPIC
BILIRUBIN URINE: NEGATIVE
Glucose, UA: NEGATIVE mg/dL
Hgb urine dipstick: NEGATIVE
KETONES UR: 20 mg/dL — AB
Leukocytes, UA: NEGATIVE
Nitrite: NEGATIVE
PH: 8 (ref 5.0–8.0)
Protein, ur: NEGATIVE mg/dL
Specific Gravity, Urine: 1.016 (ref 1.005–1.030)

## 2016-06-11 LAB — CBC
HEMATOCRIT: 45.9 % (ref 39.0–52.0)
Hemoglobin: 14.9 g/dL (ref 13.0–17.0)
MCH: 27.1 pg (ref 26.0–34.0)
MCHC: 32.5 g/dL (ref 30.0–36.0)
MCV: 83.6 fL (ref 78.0–100.0)
Platelets: 141 10*3/uL — ABNORMAL LOW (ref 150–400)
RBC: 5.49 MIL/uL (ref 4.22–5.81)
RDW: 14.4 % (ref 11.5–15.5)
WBC: 6.4 10*3/uL (ref 4.0–10.5)

## 2016-06-11 LAB — LIPASE, BLOOD: Lipase: 40 U/L (ref 11–51)

## 2016-06-11 MED ORDER — IOPAMIDOL (ISOVUE-300) INJECTION 61%
100.0000 mL | Freq: Once | INTRAVENOUS | Status: AC | PRN
  Administered 2016-06-11: 100 mL via INTRAVENOUS

## 2016-06-11 MED ORDER — ENOXAPARIN SODIUM 40 MG/0.4ML ~~LOC~~ SOLN
40.0000 mg | SUBCUTANEOUS | Status: DC
  Administered 2016-06-11: 40 mg via SUBCUTANEOUS
  Filled 2016-06-11: qty 0.4

## 2016-06-11 MED ORDER — BENZOCAINE 20 % MT AERO: INHALATION_SPRAY | Freq: Once | OROMUCOSAL | Status: DC

## 2016-06-11 MED ORDER — ONDANSETRON HCL 4 MG/2ML IJ SOLN: 4.0000 mg | Freq: Four times a day (QID) | INTRAMUSCULAR | Status: DC | PRN

## 2016-06-11 MED ORDER — BENZOCAINE 20 % MT AERO
INHALATION_SPRAY | Freq: Once | OROMUCOSAL | Status: DC
  Filled 2016-06-11: qty 57

## 2016-06-11 MED ORDER — IOPAMIDOL (ISOVUE-300) INJECTION 61%
INTRAVENOUS | Status: AC
  Filled 2016-06-11: qty 100

## 2016-06-11 MED ORDER — ONDANSETRON HCL 4 MG PO TABS: 4.0000 mg | ORAL_TABLET | Freq: Four times a day (QID) | ORAL | Status: DC | PRN

## 2016-06-11 MED ORDER — HALOPERIDOL LACTATE 5 MG/ML IJ SOLN
3.0000 mg | Freq: Once | INTRAMUSCULAR | Status: AC
  Administered 2016-06-11: 3 mg via INTRAVENOUS
  Filled 2016-06-11: qty 1

## 2016-06-11 MED ORDER — SODIUM CHLORIDE 0.9 % IV SOLN
INTRAVENOUS | Status: AC
  Administered 2016-06-11: 08:00:00 via INTRAVENOUS

## 2016-06-11 MED ORDER — SODIUM CHLORIDE 0.9 % IV SOLN: INTRAVENOUS | Status: DC

## 2016-06-11 MED ORDER — HALOPERIDOL LACTATE 5 MG/ML IJ SOLN
2.0000 mg | Freq: Once | INTRAMUSCULAR | Status: AC
  Administered 2016-06-11: 2 mg via INTRAVENOUS
  Filled 2016-06-11: qty 1

## 2016-06-11 MED ORDER — KETOROLAC TROMETHAMINE 30 MG/ML IJ SOLN
30.0000 mg | Freq: Once | INTRAMUSCULAR | Status: AC
  Administered 2016-06-11: 30 mg via INTRAVENOUS
  Filled 2016-06-11: qty 1

## 2016-06-11 MED ORDER — SODIUM CHLORIDE 0.9 % IV BOLUS (SEPSIS)
1000.0000 mL | Freq: Once | INTRAVENOUS | Status: AC
  Administered 2016-06-11: 1000 mL via INTRAVENOUS

## 2016-06-11 MED ORDER — MORPHINE SULFATE (PF) 4 MG/ML IV SOLN
4.0000 mg | Freq: Once | INTRAVENOUS | Status: AC
  Administered 2016-06-11: 4 mg via INTRAVENOUS
  Filled 2016-06-11: qty 1

## 2016-06-11 MED ORDER — FENTANYL CITRATE (PF) 100 MCG/2ML IJ SOLN: 50.0000 ug | Freq: Once | INTRAMUSCULAR | Status: DC

## 2016-06-11 MED ORDER — FENTANYL CITRATE (PF) 100 MCG/2ML IJ SOLN
100.0000 ug | Freq: Once | INTRAMUSCULAR | Status: AC
  Administered 2016-06-11: 100 ug via INTRAVENOUS
  Filled 2016-06-11: qty 2

## 2016-06-11 NOTE — ED Triage Notes (Signed)
Pt states he has a hx of intestinal blockage and is having severe abd pain  Pt states he has had vomiting

## 2016-06-11 NOTE — Progress Notes (Signed)
Pt called to report that he felt a sudden onset of "feeling like the suction is running wide open" after he coughed and turned on his side. MD notified. STAT XR ordered. Waiting on radiology techs to arrive. States pain a a 6/10. Gave a 1x dose of morphine to help with discomfort. Will continue to monitor.

## 2016-06-11 NOTE — ED Provider Notes (Signed)
[facility] DEPT Provider Note   CSN: [id number] Arrival date & time: 06/11/16  0103 By signing my name below, I, Lise Auer, attest that this documentation has been prepared under the direction and in the presence of Gizzelle Lacomb, MD. Electronically Signed: Lise Auer, ED Scribe. 06/11/16. 2:38 AM.  History   Chief Complaint Chief Complaint  Patient presents with  . Abdominal Pain   The history is provided by the patient and the spouse. No language interpreter was used.  Abdominal Pain   This is a recurrent problem. The current episode started 6 to 12 hours ago. The problem occurs constantly. The problem has not changed since onset.The pain is associated with an unknown factor. The pain is located in the RLQ. The pain is severe. Associated symptoms include nausea and vomiting. Pertinent negatives include fever, diarrhea, constipation, dysuria, frequency, hematuria, headaches, arthralgias and myalgias. Nothing aggravates the symptoms. Nothing relieves the symptoms. Past workup includes CT scan. His past medical history is significant for irritable bowel syndrome.    HPI Comments: Hudsyn Champine is a 46 y.o. male with a PMHx of Crohn's, IBS, and Non Hodgkin's lymphoma, who presents to the Emergency Department complaining of sudden onset, severe, intermittent right lower quadrant pain that started three hours PTA. His associated symptoms includes frequent nausea and vomiting. No alleviating factors noted. No treatments tried PTA. His last bowel movement was around 4 pm today, but pt's wife states he normally has a bowel movement every two hours. Pt states he ate seeded watermelon today, but denies any diet changes. He denies any other acute associated symptoms and has no other complaints at this time.  States he has not taken anything at all for pain and is on no medication at all.  He denies narcotic pain medication to EDP in the presence of 2 scribes.      Past Medical History:    Diagnosis Date  . Crohn disease ([place])   . IBS (irritable bowel syndrome)   . Non Hodgkin's lymphoma [facility])    Patient Active Problem List   Diagnosis Date Noted  . Vomiting 11/28/2013  . Abdominal discomfort 11/28/2013  . Crohn disease ([place])   . IBS (irritable bowel syndrome)   . SBO (small bowel obstruction) ([place]) 02/20/2012  . 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 EXPLORATORY LAPAROTOMY WITH ABDOMINAL MASS EXCISION  2003   Exploratory laparotomy with excisional biopsy of mesenteric lymph  . EYE SURGERY      Home Medications    Prior to Admission medications   Medication Sig Start Date End Date Taking? Authorizing Provider  Light Mineral Oil-Mineral Oil (RETAINE MGD) 0.5-0.5 % EMUL Apply 1 drop to eye as needed (for dry eyes).    Historical Provider, MD  Multiple Vitamins-Minerals (DAILY PAK MAXIMUM MULTIVITAMIN PO) Take 1 packet by mouth 2 (two) times daily. ID Life Vitamin Pack ([place]/vitamin D/Omega-3/Probiotics/Multivitamin)    Historical Provider, MD  ondansetron (ZOFRAN) 4 MG tablet Take 1 tablet (4 mg total) by mouth every 6 (six) hours. 01/05/14   Ernestina Patches, MD  oxyCODONE-acetaminophen (PERCOCET) 5-325 MG per tablet Take 1-2 tablets by mouth every 6 (six) hours as needed. 01/05/14   Ernestina Patches, MD  pantoprazole (PROTONIX) 20 MG tablet Take 20 mg by mouth daily as needed for heartburn.     Historical Provider, MD  traMADol (ULTRAM) 50 MG tablet Take 50 mg by mouth every 6 (six) hours as needed for  moderate pain.    Historical Provider, MD   Family History History reviewed. No pertinent family history.  Social History Social History  Substance Use Topics  . Smoking status: Never Smoker  . Smokeless tobacco: Never Used  . Alcohol use Yes     Comment: rarely    Allergies   Dilaudid [hydromorphone hcl]; Penicillins; and Latex  Review of Systems Review of Systems   Constitutional: Negative for fever.  Gastrointestinal: Positive for abdominal pain, nausea and vomiting. Negative for constipation and diarrhea.  Genitourinary: Negative for dysuria, frequency and hematuria.  Musculoskeletal: Negative for arthralgias and myalgias.  Neurological: Negative for headaches.  All other systems reviewed and are negative.  Physical Exam Updated Vital Signs BP 128/85 (BP Location: Right Arm)   Pulse 65   Temp 97.8 F (36.6 C) (Oral)   Resp 20   SpO2 100%   Physical Exam  Constitutional: He appears well-developed and well-nourished.  HENT:  Head: Normocephalic and atraumatic.  Mouth/Throat: Oropharynx is clear and moist. No oropharyngeal exudate.  Moist mucus membranes.   Eyes: Conjunctivae and EOM are normal. Right eye exhibits no discharge. Left eye exhibits no discharge. No scleral icterus.  Pupils are pinpoint.   Neck: Normal range of motion. Neck supple. No JVD present. No tracheal deviation present.  Trachea is midline. No stridor or carotid bruits.  Cardiovascular: Normal rate, regular rhythm, normal heart sounds and intact distal pulses.   No murmur heard. Pulmonary/Chest: Effort normal and breath sounds normal. No stridor. No respiratory distress. He has no wheezes. He has no rales.  Lungs CTA bilaterally.  Abdominal: Soft. Bowel sounds are normal. He exhibits no distension and no mass. There is no tenderness. There is no rebound and no guarding. No hernia.  Musculoskeletal: Normal range of motion. He exhibits no edema or tenderness.  All compartments are soft. No palpable cords.   Lymphadenopathy:    He has no cervical adenopathy.  Neurological: He is alert. He has normal reflexes. He displays normal reflexes.  Skin: Skin is warm and dry. Capillary refill takes less than 2 seconds.  Psychiatric: He has a normal mood and affect. His behavior is normal.  Nursing note and vitals reviewed.  ED Treatments / Results   Vitals:   06/11/16 0508  06/11/16 0530  BP: 120/72 122/82  Pulse: 60 (!) 58  Resp: 18   Temp:      DIAGNOSTIC STUDIES: Oxygen Saturation is 100% on RA, normal by my interpretation.   COORDINATION OF CARE: 2:24 AM-Discussed next steps with pt's lab work. Pt verbalized understanding and is agreeable with the plan.   Labs (all labs ordered are listed, but only abnormal results are displayed)  Results for orders placed or performed during the hospital encounter of 06/11/16  Lipase, blood  Result Value Ref Range   Lipase 40 11 - 51 U/L  Comprehensive metabolic panel  Result Value Ref Range   Sodium 138 135 - 145 mmol/L   Potassium 3.7 3.5 - 5.1 mmol/L   Chloride 101 101 - 111 mmol/L   CO2 25 22 - 32 mmol/L   Glucose, Bld 112 (H) 65 - 99 mg/dL   BUN 15 6 - 20 mg/dL   Creatinine, Ser 1.24 0.61 - 1.24 mg/dL   Calcium 9.5 8.9 - 10.3 mg/dL   Total Protein 7.7 6.5 - 8.1 g/dL   Albumin 4.5 3.5 - 5.0 g/dL   AST 24 15 - 41 U/L   ALT 19 17 - 63 U/L  Alkaline Phosphatase 82 38 - 126 U/L   Total Bilirubin 1.3 (H) 0.3 - 1.2 mg/dL   GFR calc non Af Amer >60 >60 mL/min   GFR calc Af Amer >60 >60 mL/min   Anion gap 12 5 - 15  CBC  Result Value Ref Range   WBC 6.4 4.0 - 10.5 K/uL   RBC 5.49 4.22 - 5.81 MIL/uL   Hemoglobin 14.9 13.0 - 17.0 g/dL   HCT 45.9 39.0 - 52.0 %   MCV 83.6 78.0 - 100.0 fL   MCH 27.1 26.0 - 34.0 pg   MCHC 32.5 30.0 - 36.0 g/dL   RDW 14.4 11.5 - 15.5 %   Platelets 141 (L) 150 - 400 K/uL  Urinalysis, Routine w reflex microscopic  Result Value Ref Range   Color, Urine YELLOW YELLOW   APPearance CLEAR CLEAR   Specific Gravity, Urine 1.016 1.005 - 1.030   pH 8.0 5.0 - 8.0   Glucose, UA NEGATIVE NEGATIVE mg/dL   Hgb urine dipstick NEGATIVE NEGATIVE   Bilirubin Urine NEGATIVE NEGATIVE   Ketones, ur 20 (A) NEGATIVE mg/dL   Protein, ur NEGATIVE NEGATIVE mg/dL   Nitrite NEGATIVE NEGATIVE   Leukocytes, UA NEGATIVE NEGATIVE  Rapid urine drug screen (hospital performed)  Result Value Ref  Range   Opiates POSITIVE (A) NONE DETECTED   Cocaine NONE DETECTED NONE DETECTED   Benzodiazepines NONE DETECTED NONE DETECTED   Amphetamines NONE DETECTED NONE DETECTED   Tetrahydrocannabinol NONE DETECTED NONE DETECTED   Barbiturates NONE DETECTED NONE DETECTED   Ct Abdomen Pelvis W Contrast  Result Date: 06/11/2016 CLINICAL DATA:  Initial evaluation for acute sudden onset right lower quadrant abdominal pain. EXAM: CT ABDOMEN AND PELVIS WITH CONTRAST TECHNIQUE: Multidetector CT imaging of the abdomen and pelvis was performed using the standard protocol following bolus administration of intravenous contrast. CONTRAST:  163m ISOVUE-300 IOPAMIDOL (ISOVUE-300) INJECTION 61% COMPARISON:  Prior radiograph from earlier same day. FINDINGS: Lower chest: Minimal subsegmental atelectasis seen dependently within the visualized lung bases. Visualized lungs are otherwise clear. Hepatobiliary: Liver demonstrates a normal contrast enhanced appearance. Gallbladder normal. No biliary dilatation. Pancreas: Pancreas within normal limits. Spleen: Spleen within normal limits. Adrenals/Urinary Tract: Adrenal glands are normal. Kidneys equal size with symmetric enhancement. 2 cm cyst present within the interpolar right kidney. 3 mm nonobstructive stone present within the upper pole right kidney. No other radiopaque calculi. No hydronephrosis. No focal enhancing renal mass. No hydroureter. Bladder largely decompressed without acute abnormality. Stomach/Bowel: Stomach within normal limits. There are several prominent fluid-filled loops of small bowel clustered together within the lower mid abdomen. These measure up to 5.1 cm in diameter. There is an apparent abrupt caliber change within the mid abdomen (series 4, image 34), consistent with transition point. The small bowel is decompressed distally. Finding concerning for small bowel obstruction. Underlying adhesive disease is suspected. The appendix is within normal limits. Colon  is diffusely decompressed without acute abnormality. Mild sigmoid diverticulosis without evidence for acute diverticulitis. Vascular/Lymphatic: Normal intravascular enhancement seen throughout the intra-abdominal aorta and its branch vessels. No pathologically enlarged intra-abdominal or pelvic lymph nodes identified. Reproductive: Prostate normal. Other: No free air. Small amount of free fluid within the central mesenteric, likely reactive. Surgical clips noted within the left central abdomen. Musculoskeletal: No acute osseous abnormality. No worrisome lytic or blastic osseous lesions. IMPRESSION: 1. Findings concerning for acute small bowel obstruction with transition point in the mid abdomen as above. Underlying adhesive disease is suspected. 2. No other acute intra-abdominal or  pelvic process. 3. 3 mm nonobstructive right renal nephrolithiasis. 4. Mild sigmoid diverticulosis without evidence for acute diverticulitis. Electronically Signed   By: Jeannine Boga M.D.   On: 06/11/2016 05:00   Dg Abd Acute W/chest  Result Date: 06/11/2016 CLINICAL DATA:  Nausea and mid abdominal pain for 3 hours. EXAM: DG ABDOMEN ACUTE W/ 1V CHEST COMPARISON:  01/05/2014 FINDINGS: The abdominal gas pattern is negative for obstruction or perforation. No biliary or urinary calculi. The upright view of the chest is negative for significant abnormality and is unchanged from 01/05/2014 IMPRESSION: Negative abdominal radiographs.  No acute cardiopulmonary disease. Electronically Signed   By: Andreas Newport M.D.   On: 06/11/2016 02:38   Radiology No results found.  Procedures Procedures (including critical care time)  Medications Ordered in ED  Medications  iopamidol (ISOVUE-300) 61 % injection (not administered)  0.9 %  sodium chloride infusion (not administered)  ketorolac (TORADOL) 30 MG/ML injection 30 mg (30 mg Intravenous Given 06/11/16 0247)  sodium chloride 0.9 % bolus 1,000 mL (0 mLs Intravenous Stopped  06/11/16 0355)  haloperidol lactate (HALDOL) injection 2 mg (2 mg Intravenous Given 06/11/16 0247)  haloperidol lactate (HALDOL) injection 3 mg (3 mg Intravenous Given 06/11/16 0318)  iopamidol (ISOVUE-300) 61 % injection 100 mL (100 mLs Intravenous Contrast Given 06/11/16 0426)  fentaNYL (SUBLIMAZE) injection 100 mcg (100 mcg Intravenous Given 06/11/16 0454)    Case d/w Dr. Hassell Done admit to medicine [facility] to consult NGT placement  Case d/w Dr. Alcario Drought will not be doing additional narcotics    Final Clinical Impressions(s) / ED Diagnoses  SBO:  NGT placed in the ED and admit to medicine   I personally performed the services described in this documentation, which was scribed in my presence. The recorded information has been reviewed and is accurate.       Veatrice Kells, MD 06/11/16 [phone]

## 2016-06-11 NOTE — Consult Note (Signed)
[facility] Surgery Consult Note  Eric Winters [facility] 1970-10-16  [id number].    Requesting MD: Eric Winters Chief Complaint/Reason for Consult: SBO  HPI:  Eric Winters is a 46yo male PMH significant for Non-Hodgkin lymphoma in remission and possible Crohn's disease, who presented to [facility] earlier this morning with 1 day of progressively worsening abdominal pain, nausea, and vomiting. States that he only had 4 episodes of diarrhea yesterday (rather than his normal 7 daily bouts due to IBS). States that he has had about 5 SBO's in the past treated without surgical intervention, most recently about 2 years ago. He tried limiting his diet and ambulating, but this did not help therefore he came to the ED. On CT scan he was found to have and acute small bowel obstruction with transition point in the mid abdomen. NG tube was placed with good output. States that he is already feeling a little better. No flatus this morning but feels like he may have a BM soon.  PMH significant for h/o non-Hodgkin's lymphoma (s/p chemo 2014, in remission - followed by Eric Winters at [facility]), ?Crohn's disease, IBS Abdominal surgical history: ex lap excisional biopsy of mesenteric lymph node and omental biopsy 2003 Dr. Margot Chimes Anticoagulants: none Nonsmoker Employment: self employed Designer, jewellery  ROS: Review of Systems  Constitutional: Positive for chills. Negative for fever.  HENT: Negative.   Eyes: Negative.   Respiratory: Negative.   Cardiovascular: Negative.   Gastrointestinal: Positive for abdominal pain, constipation, diarrhea, nausea and vomiting. Negative for blood in stool, heartburn and melena.  Genitourinary: Negative.   Musculoskeletal: Negative.   Skin: Negative.   Neurological: Positive for weakness.  All systems reviewed and otherwise negative except for as above  History reviewed. No pertinent family history.  Past Medical History:  Diagnosis Date  . Crohn disease ([place])   . IBS (irritable  bowel syndrome)   . Non Hodgkin's lymphoma [facility])     Past Surgical History:  Procedure Laterality Date  . EXPLORATORY LAPAROTOMY WITH ABDOMINAL MASS EXCISION  2003   Exploratory laparotomy with excisional biopsy of mesenteric lymph  . EYE SURGERY      Social History:  reports that he has never smoked. He has never used smokeless tobacco. He reports that he drinks alcohol. He reports that he does not use drugs.  Allergies:  Allergies  Allergen Reactions  . Dilaudid [Hydromorphone Hcl] Nausea And Vomiting  . Penicillins Hives  . Latex Rash    Medications Prior to Admission  Medication Sig Dispense Refill  . HYDROcodone-acetaminophen (NORCO) 10-325 MG tablet Take 1-2 tablets by mouth every 6 (six) hours as needed for moderate pain.    Sunday Corn Mineral Oil-Mineral Oil (RETAINE MGD) 0.5-0.5 % EMUL Apply 1 drop to eye as needed (for dry eyes).    . Multiple Vitamins-Minerals (DAILY PAK MAXIMUM MULTIVITAMIN PO) Take 1 packet by mouth 2 (two) times daily. ID Life Vitamin Pack ([place]/vitamin D/Omega-3/Probiotics/Multivitamin)    . pantoprazole (PROTONIX) 20 MG tablet Take 20 mg by mouth daily as needed for heartburn.     . ondansetron (ZOFRAN) 4 MG tablet Take 1 tablet (4 mg total) by mouth every 6 (six) hours. (Patient not taking: Reported on 06/11/2016) 12 tablet 0  . oxyCODONE-acetaminophen (PERCOCET) 5-325 MG per tablet Take 1-2 tablets by mouth every 6 (six) hours as needed. (Patient not taking: Reported on 06/11/2016) 10 tablet 0    Prior to Admission medications   Medication Sig Start Date End Date Taking? Authorizing Provider  HYDROcodone-acetaminophen [facility])  10-325 MG tablet Take 1-2 tablets by mouth every 6 (six) hours as needed for moderate pain.   Yes Historical Provider, MD  Light Mineral Oil-Mineral Oil (RETAINE MGD) 0.5-0.5 % EMUL Apply 1 drop to eye as needed (for dry eyes).   Yes Historical Provider, MD  Multiple Vitamins-Minerals (DAILY PAK MAXIMUM MULTIVITAMIN PO) Take 1  packet by mouth 2 (two) times daily. ID Life Vitamin Pack ([place]/vitamin D/Omega-3/Probiotics/Multivitamin)   Yes Historical Provider, MD  pantoprazole (PROTONIX) 20 MG tablet Take 20 mg by mouth daily as needed for heartburn.    Yes Historical Provider, MD  ondansetron (ZOFRAN) 4 MG tablet Take 1 tablet (4 mg total) by mouth every 6 (six) hours. Patient not taking: Reported on 06/11/2016 01/05/14   Ernestina Patches, MD  oxyCODONE-acetaminophen (PERCOCET) 5-325 MG per tablet Take 1-2 tablets by mouth every 6 (six) hours as needed. Patient not taking: Reported on 06/11/2016 01/05/14   Ernestina Patches, MD    Blood pressure 93/61, pulse (!) 54, temperature 97.8 F (36.6 C), temperature source Oral, resp. rate 18, SpO2 94 %. Physical Exam: General: pleasant, WD/WN white male who is laying in bed in NAD HEENT: head is normocephalic, atraumatic.  Sclera are noninjected.  Pupils equal and round.  Ears and nose without any masses or lesions.  Mouth is pink and moist. Dentition fair Heart: regular, rate, and rhythm.  No obvious murmurs, gallops, or rubs noted.  Palpable pedal pulses bilaterally Lungs: CTAB, no wheezes, rhonchi, or rales noted.  Respiratory effort nonlabored Abd: well healed midline incision, soft, mild distension, nontender, present but hypoactive BS, no masses, hernias, or organomegaly MS: all 4 extremities are symmetrical with no cyanosis, clubbing, or edema. Skin: warm and dry with no masses, lesions, or rashes Psych: A&Ox3 with an appropriate affect. Neuro: CM 2-12 intact, extremity CSM intact bilaterally, normal speech  Results for orders placed or performed during the hospital encounter of 06/11/16 (from the past 48 hour(s))  Lipase, blood     Status: None   Collection Time: 06/11/16  1:20 AM  Result Value Ref Range   Lipase 40 11 - 51 U/L  Comprehensive metabolic panel     Status: Abnormal   Collection Time: 06/11/16  1:20 AM  Result Value Ref Range   Sodium 138 135 - 145  mmol/L   Potassium 3.7 3.5 - 5.1 mmol/L   Chloride 101 101 - 111 mmol/L   CO2 25 22 - 32 mmol/L   Glucose, Bld 112 (H) 65 - 99 mg/dL   BUN 15 6 - 20 mg/dL   Creatinine, Ser 1.24 0.61 - 1.24 mg/dL   Calcium 9.5 8.9 - 10.3 mg/dL   Total Protein 7.7 6.5 - 8.1 g/dL   Albumin 4.5 3.5 - 5.0 g/dL   AST 24 15 - 41 U/L   ALT 19 17 - 63 U/L   Alkaline Phosphatase 82 38 - 126 U/L   Total Bilirubin 1.3 (H) 0.3 - 1.2 mg/dL   GFR calc non Af Amer >60 >60 mL/min   GFR calc Af Amer >60 >60 mL/min    Comment: (NOTE) The eGFR has been calculated using the CKD EPI equation. This calculation has not been validated in all clinical situations. eGFR's persistently <60 mL/min signify possible Chronic Kidney Disease.    Anion gap 12 5 - 15  CBC     Status: Abnormal   Collection Time: 06/11/16  1:20 AM  Result Value Ref Range   WBC 6.4 4.0 - 10.5 K/uL  RBC 5.49 4.22 - 5.81 MIL/uL   Hemoglobin 14.9 13.0 - 17.0 g/dL   HCT 45.9 39.0 - 52.0 %   MCV 83.6 78.0 - 100.0 fL   MCH 27.1 26.0 - 34.0 pg   MCHC 32.5 30.0 - 36.0 g/dL   RDW 14.4 11.5 - 15.5 %   Platelets 141 (L) 150 - 400 K/uL  Urinalysis, Routine w reflex microscopic     Status: Abnormal   Collection Time: 06/11/16  1:20 AM  Result Value Ref Range   Color, Urine YELLOW YELLOW   APPearance CLEAR CLEAR   Specific Gravity, Urine 1.016 1.005 - 1.030   pH 8.0 5.0 - 8.0   Glucose, UA NEGATIVE NEGATIVE mg/dL   Hgb urine dipstick NEGATIVE NEGATIVE   Bilirubin Urine NEGATIVE NEGATIVE   Ketones, ur 20 (A) NEGATIVE mg/dL   Protein, ur NEGATIVE NEGATIVE mg/dL   Nitrite NEGATIVE NEGATIVE   Leukocytes, UA NEGATIVE NEGATIVE  Rapid urine drug screen (hospital performed)     Status: Abnormal   Collection Time: 06/11/16  1:20 AM  Result Value Ref Range   Opiates POSITIVE (A) NONE DETECTED   Cocaine NONE DETECTED NONE DETECTED   Benzodiazepines NONE DETECTED NONE DETECTED   Amphetamines NONE DETECTED NONE DETECTED   Tetrahydrocannabinol NONE DETECTED  NONE DETECTED   Barbiturates NONE DETECTED NONE DETECTED    Comment:        DRUG SCREEN FOR MEDICAL PURPOSES ONLY.  IF CONFIRMATION IS NEEDED FOR ANY PURPOSE, NOTIFY LAB WITHIN 5 DAYS.        LOWEST DETECTABLE LIMITS FOR URINE DRUG SCREEN Drug Class       Cutoff (ng/mL) Amphetamine      1000 Barbiturate      200 Benzodiazepine   725 Tricyclics       366 Opiates          300 Cocaine          300 THC              50    Ct Abdomen Pelvis W Contrast  Result Date: 06/11/2016 CLINICAL DATA:  Initial evaluation for acute sudden onset right lower quadrant abdominal pain. EXAM: CT ABDOMEN AND PELVIS WITH CONTRAST TECHNIQUE: Multidetector CT imaging of the abdomen and pelvis was performed using the standard protocol following bolus administration of intravenous contrast. CONTRAST:  139m ISOVUE-300 IOPAMIDOL (ISOVUE-300) INJECTION 61% COMPARISON:  Prior radiograph from earlier same day. FINDINGS: Lower chest: Minimal subsegmental atelectasis seen dependently within the visualized lung bases. Visualized lungs are otherwise clear. Hepatobiliary: Liver demonstrates a normal contrast enhanced appearance. Gallbladder normal. No biliary dilatation. Pancreas: Pancreas within normal limits. Spleen: Spleen within normal limits. Adrenals/Urinary Tract: Adrenal glands are normal. Kidneys equal size with symmetric enhancement. 2 cm cyst present within the interpolar right kidney. 3 mm nonobstructive stone present within the upper pole right kidney. No other radiopaque calculi. No hydronephrosis. No focal enhancing renal mass. No hydroureter. Bladder largely decompressed without acute abnormality. Stomach/Bowel: Stomach within normal limits. There are several prominent fluid-filled loops of small bowel clustered together within the lower mid abdomen. These measure up to 5.1 cm in diameter. There is an apparent abrupt caliber change within the mid abdomen (series 4, image 34), consistent with transition point. The  small bowel is decompressed distally. Finding concerning for small bowel obstruction. Underlying adhesive disease is suspected. The appendix is within normal limits. Colon is diffusely decompressed without acute abnormality. Mild sigmoid diverticulosis without evidence for acute diverticulitis. Vascular/Lymphatic: Normal intravascular enhancement  seen throughout the intra-abdominal aorta and its branch vessels. No pathologically enlarged intra-abdominal or pelvic lymph nodes identified. Reproductive: Prostate normal. Other: No free air. Small amount of free fluid within the central mesenteric, likely reactive. Surgical clips noted within the left central abdomen. Musculoskeletal: No acute osseous abnormality. No worrisome lytic or blastic osseous lesions. IMPRESSION: 1. Findings concerning for acute small bowel obstruction with transition point in the mid abdomen as above. Underlying adhesive disease is suspected. 2. No other acute intra-abdominal or pelvic process. 3. 3 mm nonobstructive right renal nephrolithiasis. 4. Mild sigmoid diverticulosis without evidence for acute diverticulitis. Electronically Signed   By: Jeannine Boga M.D.   On: 06/11/2016 05:00   Dg Abd Acute W/chest  Result Date: 06/11/2016 CLINICAL DATA:  Nausea and mid abdominal pain for 3 hours. EXAM: DG ABDOMEN ACUTE W/ 1V CHEST COMPARISON:  01/05/2014 FINDINGS: The abdominal gas pattern is negative for obstruction or perforation. No biliary or urinary calculi. The upright view of the chest is negative for significant abnormality and is unchanged from 01/05/2014 IMPRESSION: Negative abdominal radiographs.  No acute cardiopulmonary disease. Electronically Signed   By: Andreas Newport M.D.   On: 06/11/2016 02:38   Dg Abd Portable 1v  Result Date: 06/11/2016 CLINICAL DATA:  Status post nasogastric tube placement EXAM: PORTABLE ABDOMEN - 1 VIEW COMPARISON:  CT scan of the abdomen and pelvis of Jun 11, 2016 FINDINGS: The esophagogastric  tubes proximal port lies at or just above the GE junction. The tip lies in the proximal gastric body. The bowel gas pattern is within the limits of normal. There is contrast within the renal collecting systems. IMPRESSION: The proximal port of the endotracheal tube lies at or just above the GE junction. Advancement of the tube by 10 cm is recommended. Electronically Signed   By: David  Martinique M.D.   On: 06/11/2016 07:15   Anti-infectives    None        Assessment/Plan SBO likely 2/2 adhesions - abdominal surgical history: ex lap excisional biopsy of mesenteric lymph node and omental biopsy 2003 Dr. Margot Chimes - 1 day of abdominal pain, nausea, vomiting; h/o about 5 self limiting SBO's - CT scan shows acute small bowel obstruction with transition point in the mid abdomen  Crohn's disease?? IBS Non-Hodgkin lymphoma - s/p chemo 2014, in remission - followed by Eric Winters at [facility]  ID - none VTE - SCDs, lovenox FEN - IVF, NPO/NGT  Plan - Continue NG tube and bowel rest. Will wait on starting small bowel protocol as patient already feels like he is improving. If bowel function does not return today will consider SB protocol/gastrograffin study tomorrow. Ok to clamp NG tube for about 20 minutes a few times today to allow Mr. Deemer to ambulate. General surgery will continue to follow.  Jerrye Beavers, [facility] Surgery 06/11/2016, 7:55 AM Pager: [phone] Consults: [phone] Mon-Fri 7:00 am-4:30 pm Sat-Sun 7:00 am-11:30 am

## 2016-06-11 NOTE — ED Notes (Signed)
Reminded pt that a urine sample is needed

## 2016-06-11 NOTE — Progress Notes (Addendum)
XR showed NG tube not in place, see impression. Patient adamant about this RN removing. Night MD placed order to remove and reinsert. Refusing replacement for tonight, says "will see what MD says tomorrow about SBO." He admits that he will agree to replacement overnight if begins to feel nauseous or vomit.. Will continue to monitor.

## 2016-06-11 NOTE — Care Management Note (Signed)
Case Management Note  Patient Details  Name: Eric Winters MRN: [id number] Date of Birth: 04/24/1970  Subjective/Objective:      sbo,npo,iv flds,ng tube              Action/Plan: from home Date:  Jun 11, 2016 Chart reviewed for concurrent status and case management needs. Will continue to follow patient progress. Discharge Planning: following for needs Expected discharge date: [id number] Velva Harman, BSN, [place], [place]  Expected Discharge Date:                  Expected Discharge Plan:  Home/Self Care  In-House Referral:     Discharge planning Services  CM Consult  Post Acute Care Choice:    Choice offered to:     DME Arranged:    DME Agency:     HH Arranged:    HH Agency:     Status of Service:  In process, will continue to follow  If discussed at Long Length of Stay Meetings, dates discussed:    Additional Comments:  Leeroy Cha, RN 06/11/2016, 8:37 AM

## 2016-06-11 NOTE — H&P (Signed)
History and Physical    Eric Winters FOY:[id number] DOB: 1970/12/27 DOA: 06/11/2016  I have briefly reviewed the patient's prior medical records in [place]  PCP: [name], MD  Patient coming from: home  Chief Complaint: Abdominal pain, nausea and vomiting  HPI: Eric Winters is a 46 y.o. male with medical history significant of non-Hodgkin's lymphoma, in remission, Crohn's disease, presents to the emergency room with chief complaint of abdominal pain, nausea vomiting starting 06/10/2016 afternoon.  Patient tells me he has a history of recurrent small bowel obstruction, and this feels the same way.  He states that he has not passed gas or had a bowel movement in the last 12 hours.  On my evaluation, she has already had an NG tube placed and he is feeling a little bit better with almost no abdominal pain, and no nausea.  He denies any chest pain or shortness of breath.  He denies any fevers however complains of intermittent chills here in the emergency room.  He has no cough.  He denies prior history of heart or lung issues, and other than the above-mentioned problems he is otherwise healthy.  ED Course: In the emergency room, patient's vital signs are stable, his blood work shows normal renal function, slightly elevated bilirubin at 1.3, and slightly decreased platelets of 141.  A CT scan of the abdomen and pelvis showed small bowel obstruction, patient got an NG tube placed and general surgery was consulted.  TRH was asked for admission.  Review of Systems: As per HPI otherwise 10 point review of systems negative.   Past Medical History:  Diagnosis Date  . Crohn disease ([place])   . IBS (irritable bowel syndrome)   . Non Hodgkin's lymphoma [facility])     Past Surgical History:  Procedure Laterality Date  . EXPLORATORY LAPAROTOMY WITH ABDOMINAL MASS EXCISION  2003   Exploratory laparotomy with excisional biopsy of mesenteric lymph  . EYE SURGERY       reports that he has  never smoked. He has never used smokeless tobacco. He reports that he drinks alcohol. He reports that he does not use drugs.  Allergies  Allergen Reactions  . Dilaudid [Hydromorphone Hcl] Nausea And Vomiting  . Penicillins Hives  . Latex Rash   Family history pertinent for his father having non-Hodgkin's lymphoma  Prior to Admission medications   Medication Sig Start Date End Date Taking? Authorizing Provider  HYDROcodone-acetaminophen (NORCO) 10-325 MG tablet Take 1-2 tablets by mouth every 6 (six) hours as needed for moderate pain.   Yes Historical Provider, MD  Light Mineral Oil-Mineral Oil (RETAINE MGD) 0.5-0.5 % EMUL Apply 1 drop to eye as needed (for dry eyes).   Yes Historical Provider, MD  Multiple Vitamins-Minerals (DAILY PAK MAXIMUM MULTIVITAMIN PO) Take 1 packet by mouth 2 (two) times daily. ID Life Vitamin Pack ([place]/vitamin D/Omega-3/Probiotics/Multivitamin)   Yes Historical Provider, MD  pantoprazole (PROTONIX) 20 MG tablet Take 20 mg by mouth daily as needed for heartburn.    Yes Historical Provider, MD  ondansetron (ZOFRAN) 4 MG tablet Take 1 tablet (4 mg total) by mouth every 6 (six) hours. Patient not taking: Reported on 06/11/2016 01/05/14   Ernestina Patches, MD  oxyCODONE-acetaminophen (PERCOCET) 5-325 MG per tablet Take 1-2 tablets by mouth every 6 (six) hours as needed. Patient not taking: Reported on 06/11/2016 01/05/14   Ernestina Patches, MD    Physical Exam: Vitals:   06/11/16 0500 06/11/16 0508 06/11/16 0530 06/11/16 0653  BP: 120/72 120/72  122/82 93/61  Pulse: 61 60 (!) 58 (!) 54  Resp:  18  18  Temp:      TempSrc:      SpO2: 96% 93% 97% 94%    Constitutional: NAD, calm, comfortable, NG tube in place Vitals:   06/11/16 0500 06/11/16 0508 06/11/16 0530 06/11/16 0653  BP: 120/72 120/72 122/82 93/61  Pulse: 61 60 (!) 58 (!) 54  Resp:  18  18  Temp:      TempSrc:      SpO2: 96% 93% 97% 94%   Eyes: PERRL, lids and conjunctivae normal ENMT: Mucous  membranes are moist.   Neck: normal, supple, Respiratory: clear to auscultation bilaterally, no wheezing, no crackles. Normal respiratory effort.  Cardiovascular: Regular rate and rhythm, no murmurs / rubs / gallops. No extremity edema. 2+ pedal pulses.  Abdomen: no tenderness, no masses palpated.  Decreased bowel sounds Musculoskeletal: no clubbing / cyanosis. Normal muscle tone.  Skin: no rashes, lesions, ulcers. No induration Neurologic: CN 2-12 grossly intact. Strength 5/5 in all 4.  Psychiatric: Normal judgment and insight. Alert and oriented x 3. Normal mood.   Labs on Admission: I have personally reviewed following labs and imaging studies  CBC:  Recent Labs Lab 06/11/16 0120  WBC 6.4  HGB 14.9  HCT 45.9  MCV 83.6  PLT 893*   Basic Metabolic Panel:  Recent Labs Lab 06/11/16 0120  NA 138  K 3.7  CL 101  CO2 25  GLUCOSE 112*  BUN 15  CREATININE 1.24  CALCIUM 9.5   GFR: CrCl cannot be calculated (Unknown ideal weight.). Liver Function Tests:  Recent Labs Lab 06/11/16 0120  AST 24  ALT 19  ALKPHOS 82  BILITOT 1.3*  PROT 7.7  ALBUMIN 4.5    Recent Labs Lab 06/11/16 0120  LIPASE 40   No results for input(s): AMMONIA in the last 168 hours. Coagulation Profile: No results for input(s): INR, PROTIME in the last 168 hours. Cardiac Enzymes: No results for input(s): CKTOTAL, CKMB, CKMBINDEX, TROPONINI in the last 168 hours. BNP (last 3 results) No results for input(s): PROBNP in the last 8760 hours. HbA1C: No results for input(s): HGBA1C in the last 72 hours. CBG: No results for input(s): GLUCAP in the last 168 hours. Lipid Profile: No results for input(s): CHOL, HDL, LDLCALC, TRIG, CHOLHDL, LDLDIRECT in the last 72 hours. Thyroid Function Tests: No results for input(s): TSH, T4TOTAL, FREET4, T3FREE, THYROIDAB in the last 72 hours. Anemia Panel: No results for input(s): VITAMINB12, FOLATE, FERRITIN, TIBC, IRON, RETICCTPCT in the last 72  hours. Urine analysis:    Component Value Date/Time   COLORURINE YELLOW 06/11/2016 0120   APPEARANCEUR CLEAR 06/11/2016 0120   LABSPEC 1.016 06/11/2016 0120   PHURINE 8.0 06/11/2016 0120   GLUCOSEU NEGATIVE 06/11/2016 0120   HGBUR NEGATIVE 06/11/2016 0120   BILIRUBINUR NEGATIVE 06/11/2016 0120   KETONESUR 20 (A) 06/11/2016 0120   PROTEINUR NEGATIVE 06/11/2016 0120   UROBILINOGEN 0.2 11/28/2013 0233   NITRITE NEGATIVE 06/11/2016 0120   LEUKOCYTESUR NEGATIVE 06/11/2016 0120     Radiological Exams on Admission: Ct Abdomen Pelvis W Contrast  Result Date: 06/11/2016 CLINICAL DATA:  Initial evaluation for acute sudden onset right lower quadrant abdominal pain. EXAM: CT ABDOMEN AND PELVIS WITH CONTRAST TECHNIQUE: Multidetector CT imaging of the abdomen and pelvis was performed using the standard protocol following bolus administration of intravenous contrast. CONTRAST:  153m ISOVUE-300 IOPAMIDOL (ISOVUE-300) INJECTION 61% COMPARISON:  Prior radiograph from earlier same day. FINDINGS: Lower chest:  Minimal subsegmental atelectasis seen dependently within the visualized lung bases. Visualized lungs are otherwise clear. Hepatobiliary: Liver demonstrates a normal contrast enhanced appearance. Gallbladder normal. No biliary dilatation. Pancreas: Pancreas within normal limits. Spleen: Spleen within normal limits. Adrenals/Urinary Tract: Adrenal glands are normal. Kidneys equal size with symmetric enhancement. 2 cm cyst present within the interpolar right kidney. 3 mm nonobstructive stone present within the upper pole right kidney. No other radiopaque calculi. No hydronephrosis. No focal enhancing renal mass. No hydroureter. Bladder largely decompressed without acute abnormality. Stomach/Bowel: Stomach within normal limits. There are several prominent fluid-filled loops of small bowel clustered together within the lower mid abdomen. These measure up to 5.1 cm in diameter. There is an apparent abrupt caliber  change within the mid abdomen (series 4, image 34), consistent with transition point. The small bowel is decompressed distally. Finding concerning for small bowel obstruction. Underlying adhesive disease is suspected. The appendix is within normal limits. Colon is diffusely decompressed without acute abnormality. Mild sigmoid diverticulosis without evidence for acute diverticulitis. Vascular/Lymphatic: Normal intravascular enhancement seen throughout the intra-abdominal aorta and its branch vessels. No pathologically enlarged intra-abdominal or pelvic lymph nodes identified. Reproductive: Prostate normal. Other: No free air. Small amount of free fluid within the central mesenteric, likely reactive. Surgical clips noted within the left central abdomen. Musculoskeletal: No acute osseous abnormality. No worrisome lytic or blastic osseous lesions. IMPRESSION: 1. Findings concerning for acute small bowel obstruction with transition point in the mid abdomen as above. Underlying adhesive disease is suspected. 2. No other acute intra-abdominal or pelvic process. 3. 3 mm nonobstructive right renal nephrolithiasis. 4. Mild sigmoid diverticulosis without evidence for acute diverticulitis. Electronically Signed   By: Jeannine Boga M.D.   On: 06/11/2016 05:00   Dg Abd Acute W/chest  Result Date: 06/11/2016 CLINICAL DATA:  Nausea and mid abdominal pain for 3 hours. EXAM: DG ABDOMEN ACUTE W/ 1V CHEST COMPARISON:  01/05/2014 FINDINGS: The abdominal gas pattern is negative for obstruction or perforation. No biliary or urinary calculi. The upright view of the chest is negative for significant abnormality and is unchanged from 01/05/2014 IMPRESSION: Negative abdominal radiographs.  No acute cardiopulmonary disease. Electronically Signed   By: Andreas Newport M.D.   On: 06/11/2016 02:38   Dg Abd Portable 1v  Result Date: 06/11/2016 CLINICAL DATA:  Status post nasogastric tube placement EXAM: PORTABLE ABDOMEN - 1 VIEW  COMPARISON:  CT scan of the abdomen and pelvis of Jun 11, 2016 FINDINGS: The esophagogastric tubes proximal port lies at or just above the GE junction. The tip lies in the proximal gastric body. The bowel gas pattern is within the limits of normal. There is contrast within the renal collecting systems. IMPRESSION: The proximal port of the endotracheal tube lies at or just above the GE junction. Advancement of the tube by 10 cm is recommended. Electronically Signed   By: David  Martinique M.D.   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  Crohn's disease -This is a questionable diagnosis per patient so it is not entirely clear if he actually has that.  Currently he is on no medications for that.  This is to  be addressed in an outpatient setting  Non-Hodgkin's lymphoma -This is in remission for 4 years, continue observation  Thrombocytopenia -Chronic, no bleeding, monitor   DVT prophylaxis: Lovenox Code Status: Full code Family Communication: Discussed with wife at bedside Disposition Plan: Admit to MedSurg Consults called: General surgery    Admission status: Inpatient  At the time of admission, it appears that the appropriate admission status for this patient is INPATIENT. This is judged to be reasonable and necessary in order to provide the required high service intensity to ensure the patient's safety given the presenting symptoms, physical exam findings, and initial radiographic and laboratory data in the context of their chronic comorbidities. Current circumstances are small bowel obstruction, n.p.o., IV fluids,  and it is felt to place patient at high risk for further clinical deterioration threatening life, limb, or organ. Moreover, it is my clinical judgment that the patient will require inpatient hospital care spanning beyond 2 midnights from the point of admission and that early discharge would result in unnecessary risk of decompensation and readmission or threat to life, limb or bodily function.   Marzetta Board, MD [facility] Pager [phone]  If 7PM-7AM, please contact night-coverage [email or web address] Password [email or web address]  06/11/2016, 7:28 AM

## 2016-06-12 LAB — COMPREHENSIVE METABOLIC PANEL
ALT: 8 U/L — ABNORMAL LOW (ref 17–63)
ANION GAP: 7 (ref 5–15)
AST: 32 U/L (ref 15–41)
Albumin: 3.6 g/dL (ref 3.5–5.0)
Alkaline Phosphatase: 59 U/L (ref 38–126)
BILIRUBIN TOTAL: 2.1 mg/dL — AB (ref 0.3–1.2)
BUN: 13 mg/dL (ref 6–20)
CHLORIDE: 107 mmol/L (ref 101–111)
CO2: 26 mmol/L (ref 22–32)
Calcium: 8.7 mg/dL — ABNORMAL LOW (ref 8.9–10.3)
Creatinine, Ser: 1.03 mg/dL (ref 0.61–1.24)
Glucose, Bld: 96 mg/dL (ref 65–99)
POTASSIUM: 4.3 mmol/L (ref 3.5–5.1)
Sodium: 140 mmol/L (ref 135–145)
TOTAL PROTEIN: 6.3 g/dL — AB (ref 6.5–8.1)

## 2016-06-12 LAB — CBC
HCT: 41.2 % (ref 39.0–52.0)
Hemoglobin: 12.9 g/dL — ABNORMAL LOW (ref 13.0–17.0)
MCH: 26.8 pg (ref 26.0–34.0)
MCHC: 31.3 g/dL (ref 30.0–36.0)
MCV: 85.7 fL (ref 78.0–100.0)
Platelets: 125 10*3/uL — ABNORMAL LOW (ref 150–400)
RBC: 4.81 MIL/uL (ref 4.22–5.81)
RDW: 14.8 % (ref 11.5–15.5)
WBC: 4.3 10*3/uL (ref 4.0–10.5)

## 2016-06-12 LAB — HIV ANTIBODY (ROUTINE TESTING W REFLEX): HIV Screen 4th Generation wRfx: NONREACTIVE

## 2016-06-12 NOTE — Discharge Instructions (Signed)
Avoid eating any raw fruits or vegetables for 2 weeks.

## 2016-06-12 NOTE — Progress Notes (Signed)
Date: Jun 12, 2016 Discharge orders review for case management needs.  None found Velva Harman, BSN, [place], [place]: [phone]

## 2016-06-12 NOTE — Progress Notes (Signed)
Pt given D/C instructions and handout, pt denies pain at the time of D/C. Pt ambulated off unit to front door with nursing staff. Belongings taken with pt.

## 2016-06-12 NOTE — Progress Notes (Signed)
Assessment Active Problems:   SBO (small bowel obstruction) (HCC)-clinically resolving      Plan:  Full liquid diet, if tolerated could advance to soft diet and possibly discharge late today.   LOS: 1 day        Chief Complaint/Subjective: Ng tube is out.  He feels much better.  He has been passing a lot of gas.  Had a BM this AM.  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  Objective: Vital signs in last 24 hours: Temp:  [97.9 F (36.6 C)-98.4 F (36.9 C)] 98.4 F (36.9 C) (05/04 0544) Pulse Rate:  [72-79] 72 (05/04 0544) Resp:  [18] 18 (05/04 0544) BP: (122-133)/(62-76) 122/62 (05/04 0544) SpO2:  [96 %-98 %] 98 % (05/04 0544) Weight:  [99.8 kg (220 lb)] 99.8 kg (220 lb) (05/03 0900) Last BM Date: 06/11/16  Intake/Output from previous day: 05/03 0701 - 05/04 0700 In: 1445.8 [I.V.:1445.8] Out: 950 [Urine:750; Emesis/NG output:200] Intake/Output this shift: No intake/output data recorded.  PE: General- In NAD.  Awake and alert. Abdomen-soft, not tender, not distended, active bowel sounds  Lab Results:   Recent Labs  06/11/16 0120  WBC 6.4  HGB 14.9  HCT 45.9  PLT 141*   BMET  Recent Labs  06/11/16 0120  NA 138  K 3.7  CL 101  CO2 25  GLUCOSE 112*  BUN 15  CREATININE 1.24  CALCIUM 9.5   PT/INR No results for input(s): LABPROT, INR in the last 72 hours. Comprehensive Metabolic Panel:    Component Value Date/Time   NA 138 06/11/2016 0120   NA 138 01/05/2014 0601   K 3.7 06/11/2016 0120   K 3.9 01/05/2014 0601   CL 101 06/11/2016 0120   CL 98 01/05/2014 0601   CO2 25 06/11/2016 0120   CO2 25 01/05/2014 0601   BUN 15 06/11/2016 0120   BUN 12 01/05/2014 0601   CREATININE 1.24 06/11/2016 0120   CREATININE 1.15 01/05/2014 0601   GLUCOSE 112 (H) 06/11/2016 0120   GLUCOSE 123 (H) 01/05/2014 0601   CALCIUM 9.5 06/11/2016 0120   CALCIUM 10.0 01/05/2014 0601   AST 24 06/11/2016 0120    AST 25 01/05/2014 0601   ALT 19 06/11/2016 0120   ALT 20 01/05/2014 0601   ALKPHOS 82 06/11/2016 0120   ALKPHOS 88 01/05/2014 0601   BILITOT 1.3 (H) 06/11/2016 0120   BILITOT 0.7 01/05/2014 0601   PROT 7.7 06/11/2016 0120   PROT 7.7 01/05/2014 0601   ALBUMIN 4.5 06/11/2016 0120   ALBUMIN 4.0 01/05/2014 0601     Studies/Results: Dg Cervical Spine 1 View  Result Date: 06/11/2016 CLINICAL DATA:  46 y/o  M; nasogastric tube placement. EXAM: CERVICAL SPINE 1 VIEW COMPARISON:  None. FINDINGS: The tip of the nasogastric tube projects over the upper neck at the C3 level. Normal tracheal air column. No acute osseous abnormality is identified. IMPRESSION: Tip of nasogastric tube projects over upper neck. Repositioning and reimaging is recommended. Electronically Signed   By: Kristine Garbe M.D.   On: 06/11/2016 22:36   Ct Abdomen Pelvis W Contrast  Result Date: 06/11/2016 CLINICAL DATA:  Initial evaluation for acute sudden onset right lower quadrant abdominal pain. EXAM: CT ABDOMEN AND PELVIS WITH CONTRAST TECHNIQUE: Multidetector CT imaging of the abdomen and pelvis was performed using the standard protocol following bolus administration of intravenous contrast. CONTRAST:  11m ISOVUE-300 IOPAMIDOL (ISOVUE-300) INJECTION 61% COMPARISON:  Prior radiograph from earlier same day. FINDINGS: Lower chest: Minimal subsegmental atelectasis seen dependently within the visualized lung bases. Visualized lungs are otherwise clear. Hepatobiliary: Liver demonstrates a normal contrast enhanced appearance. Gallbladder normal. No biliary dilatation. Pancreas: Pancreas within normal limits. Spleen: Spleen within normal limits. Adrenals/Urinary Tract: Adrenal glands are normal. Kidneys equal size with symmetric enhancement. 2 cm cyst present within the interpolar right kidney. 3 mm nonobstructive stone present within the upper pole right kidney. No other radiopaque calculi. No hydronephrosis. No focal enhancing  renal mass. No hydroureter. Bladder largely decompressed without acute abnormality. Stomach/Bowel: Stomach within normal limits. There are several prominent fluid-filled loops of small bowel clustered together within the lower mid abdomen. These measure up to 5.1 cm in diameter. There is an apparent abrupt caliber change within the mid abdomen (series 4, image 34), consistent with transition point. The small bowel is decompressed distally. Finding concerning for small bowel obstruction. Underlying adhesive disease is suspected. The appendix is within normal limits. Colon is diffusely decompressed without acute abnormality. Mild sigmoid diverticulosis without evidence for acute diverticulitis. Vascular/Lymphatic: Normal intravascular enhancement seen throughout the intra-abdominal aorta and its branch vessels. No pathologically enlarged intra-abdominal or pelvic lymph nodes identified. Reproductive: Prostate normal. Other: No free air. Small amount of free fluid within the central mesenteric, likely reactive. Surgical clips noted within the left central abdomen. Musculoskeletal: No acute osseous abnormality. No worrisome lytic or blastic osseous lesions. IMPRESSION: 1. Findings concerning for acute small bowel obstruction with transition point in the mid abdomen as above. Underlying adhesive disease is suspected. 2. No other acute intra-abdominal or pelvic process. 3. 3 mm nonobstructive right renal nephrolithiasis. 4. Mild sigmoid diverticulosis without evidence for acute diverticulitis. Electronically Signed   By: Jeannine Boga M.D.   On: 06/11/2016 05:00   Dg Abd Acute W/chest  Result Date: 06/11/2016 CLINICAL DATA:  Nausea and mid abdominal pain for 3 hours. EXAM: DG ABDOMEN ACUTE W/ 1V CHEST COMPARISON:  01/05/2014 FINDINGS: The abdominal gas pattern is negative for obstruction or perforation. No biliary or urinary calculi. The upright view of the chest is negative for significant abnormality and is  unchanged from 01/05/2014 IMPRESSION: Negative abdominal radiographs.  No acute cardiopulmonary disease. Electronically Signed   By: Andreas Newport M.D.   On: 06/11/2016 02:38   Dg Abd Portable 1v  Result Date: 06/11/2016 CLINICAL DATA:  46 y/o  M; enteric tube placement. EXAM: PORTABLE ABDOMEN - 1 VIEW COMPARISON:  None. FINDINGS: Normal bowel gas pattern. Surgical clips project over the left lower quadrant. Clear visualized lung fields. A nasogastric tube is not identified. IMPRESSION: A nasogastric tube is not identified within the visualized chest or abdomen. Repositioning and reimaging is recommended. These results will be called to the ordering clinician or representative by the Radiologist Assistant, and communication documented in the PACS or zVision Dashboard. Electronically Signed   By: Kristine Garbe M.D.   On: 06/11/2016 22:33   Dg Abd Portable 1v  Result Date: 06/11/2016 CLINICAL DATA:  Status post nasogastric tube placement EXAM: PORTABLE ABDOMEN - 1 VIEW COMPARISON:  CT scan of the abdomen and pelvis of Jun 11, 2016 FINDINGS: The esophagogastric tubes proximal port lies at or just above the GE junction. The tip lies in the proximal gastric body. The bowel gas pattern is within the limits of normal. There is contrast within the renal collecting systems. IMPRESSION: The proximal port of the endotracheal tube lies at or just above the GE junction. Advancement of the tube by 10  cm is recommended. Electronically Signed   By: David  Martinique M.D.   On: 06/11/2016 07:15    Anti-infectives: Anti-infectives    None       Eric Winters J 06/12/2016

## 2016-06-13 NOTE — Discharge Summary (Signed)
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 Passing gas. No nausea or vomiting since NG came out.  Brief Summary: Eric Winters is a 46 y.o. male with medical history significant of non-Hodgkin's lymphoma, in remission, Crohn's disease, presents to the emergency room with chief complaint of abdominal pain, nausea vomiting starting 06/10/2016 afternoon.  Patient tells me he has a history of recurrent small bowel obstruction, and this feels the same way.  He states that he has not passed gas or had a bowel movement in the last 12 hours.  Hospital Course:  Small bowel obstruction - recurrent episodes -managed conservatively- NG came out and patient did not want another- he was done well with a diet and is now tolerating solid food- OK to discharge home.   - general surgery  consult appreciated.  Crohn's disease -This is a questionable diagnosis per patient so it is not entirely clear if he actually has that.  Currently he is on no medications for that.  This is to be addressed in an outpatient setting  Non-Hodgkin's lymphoma -This is in remission for 4 years, continue observation  Thrombocytopenia -Chronic, no bleeding, monitor  Discharge Instructions  Discharge Instructions    Diet - low sodium heart healthy    Complete by:  As directed    Increase activity slowly    Complete by:  As directed      Allergies as of 06/12/2016      Reactions   Dilaudid [hydromorphone Hcl] Nausea And  Vomiting   Penicillins Hives   Latex Rash      Medication List    TAKE these medications   DAILY PAK MAXIMUM MULTIVITAMIN PO Take 1 packet by mouth 2 (two) times daily. ID Life Vitamin Pack ([place]/vitamin D/Omega-3/Probiotics/Multivitamin)   HYDROcodone-acetaminophen 10-325 MG tablet Commonly known as:  NORCO Take 1-2 tablets by mouth every 6 (six) hours as needed for moderate pain.   pantoprazole 20 MG tablet Commonly known as:  PROTONIX Take 20 mg by mouth daily as needed for heartburn.   RETAINE MGD 0.5-0.5 % Emul Generic drug:  Light Mineral Oil-Mineral Oil Apply 1 drop to eye as needed (for dry eyes).      Follow-up Information    Jackolyn Confer, MD. Call.   Specialty:  General Surgery Why:  as needed Contact information: [address] [place] [address] [phone]          Allergies  Allergen Reactions  . Dilaudid [Hydromorphone Hcl] Nausea And Vomiting  . Penicillins Hives  . Latex Rash     Procedures/Studies: NG tube placement  Dg Cervical Spine 1 View  Result Date: 06/11/2016 CLINICAL DATA:  46 y/o  M; nasogastric tube placement. EXAM: CERVICAL SPINE 1 VIEW COMPARISON:  None. FINDINGS: The tip of the nasogastric tube projects over the upper neck at the C3 level. Normal tracheal air column. No acute osseous abnormality is identified. IMPRESSION: Tip of nasogastric tube projects over upper neck. Repositioning and reimaging is recommended.  Electronically Signed   By: Kristine Garbe M.D.   On: 06/11/2016 22:36   Ct Abdomen Pelvis W Contrast  Result Date: 06/11/2016 CLINICAL DATA:  Initial evaluation for acute sudden onset right lower quadrant abdominal pain. EXAM: CT ABDOMEN AND PELVIS WITH CONTRAST TECHNIQUE: Multidetector CT imaging of the abdomen and pelvis was performed using the standard protocol following bolus administration of intravenous contrast. CONTRAST:  16m ISOVUE-300 IOPAMIDOL (ISOVUE-300) INJECTION 61%  COMPARISON:  Prior radiograph from earlier same day. FINDINGS: Lower chest: Minimal subsegmental atelectasis seen dependently within the visualized lung bases. Visualized lungs are otherwise clear. Hepatobiliary: Liver demonstrates a normal contrast enhanced appearance. Gallbladder normal. No biliary dilatation. Pancreas: Pancreas within normal limits. Spleen: Spleen within normal limits. Adrenals/Urinary Tract: Adrenal glands are normal. Kidneys equal size with symmetric enhancement. 2 cm cyst present within the interpolar right kidney. 3 mm nonobstructive stone present within the upper pole right kidney. No other radiopaque calculi. No hydronephrosis. No focal enhancing renal mass. No hydroureter. Bladder largely decompressed without acute abnormality. Stomach/Bowel: Stomach within normal limits. There are several prominent fluid-filled loops of small bowel clustered together within the lower mid abdomen. These measure up to 5.1 cm in diameter. There is an apparent abrupt caliber change within the mid abdomen (series 4, image 34), consistent with transition point. The small bowel is decompressed distally. Finding concerning for small bowel obstruction. Underlying adhesive disease is suspected. The appendix is within normal limits. Colon is diffusely decompressed without acute abnormality. Mild sigmoid diverticulosis without evidence for acute diverticulitis. Vascular/Lymphatic: Normal intravascular enhancement seen throughout the intra-abdominal aorta and its branch vessels. No pathologically enlarged intra-abdominal or pelvic lymph nodes identified. Reproductive: Prostate normal. Other: No free air. Small amount of free fluid within the central mesenteric, likely reactive. Surgical clips noted within the left central abdomen. Musculoskeletal: No acute osseous abnormality. No worrisome lytic or blastic osseous lesions. IMPRESSION: 1. Findings concerning for acute small bowel obstruction with transition point in  the mid abdomen as above. Underlying adhesive disease is suspected. 2. No other acute intra-abdominal or pelvic process. 3. 3 mm nonobstructive right renal nephrolithiasis. 4. Mild sigmoid diverticulosis without evidence for acute diverticulitis. Electronically Signed   By: BJeannine BogaM.D.   On: 06/11/2016 05:00   Dg Abd Acute W/chest  Result Date: 06/11/2016 CLINICAL DATA:  Nausea and mid abdominal pain for 3 hours. EXAM: DG ABDOMEN ACUTE W/ 1V CHEST COMPARISON:  01/05/2014 FINDINGS: The abdominal gas pattern is negative for obstruction or perforation. No biliary or urinary calculi. The upright view of the chest is negative for significant abnormality and is unchanged from 01/05/2014 IMPRESSION: Negative abdominal radiographs.  No acute cardiopulmonary disease. Electronically Signed   By: DAndreas NewportM.D.   On: 06/11/2016 02:38   Dg Abd Portable 1v  Result Date: 06/11/2016 CLINICAL DATA:  46y/o  M; enteric tube placement. EXAM: PORTABLE ABDOMEN - 1 VIEW COMPARISON:  None. FINDINGS: Normal bowel gas pattern. Surgical clips project over the left lower quadrant. Clear visualized lung fields. A nasogastric tube is not identified. IMPRESSION: A nasogastric tube is not identified within the visualized chest or abdomen. Repositioning and reimaging is recommended. These results will be called to the ordering clinician or representative by the Radiologist Assistant, and communication documented in the PACS or zVision Dashboard. Electronically Signed   By: LKristine GarbeM.D.   On: 06/11/2016 22:33   Dg Abd Portable 1v  Result Date: 06/11/2016 CLINICAL DATA:  Status post nasogastric tube placement EXAM: PORTABLE ABDOMEN -  1 VIEW COMPARISON:  CT scan of the abdomen and pelvis of Jun 11, 2016 FINDINGS: The esophagogastric tubes proximal port lies at or just above the GE junction. The tip lies in the proximal gastric body. The bowel gas pattern is within the limits of normal. There is contrast  within the renal collecting systems. IMPRESSION: The proximal port of the endotracheal tube lies at or just above the GE junction. Advancement of the tube by 10 cm is recommended. Electronically Signed   By: David  Martinique M.D.   On: 06/11/2016 07:15        Discharge Exam: Vitals:   06/12/16 0544 06/12/16 1420  BP: 122/62 127/70  Pulse: 72 69  Resp: 18 18  Temp: 98.4 F (36.9 C) 98 F (36.7 C)   Vitals:   06/11/16 1336 06/11/16 2256 06/12/16 0544 06/12/16 1420  BP: 131/64 133/69 122/62 127/70  Pulse: 79 74 72 69  Resp: 18 18 18 18   Temp: 98.4 F (36.9 C) 98.1 F (36.7 C) 98.4 F (36.9 C) 98 F (36.7 C)  TempSrc: Oral Oral Oral Oral  SpO2: 96% 97% 98% 98%  Weight:      Height:        General: Pt is alert, awake, not in acute distress Cardiovascular: RRR, S1/S2 +, no rubs, no gallops Respiratory: CTA bilaterally, no wheezing, no rhonchi Abdominal: Soft, NT, ND, bowel sounds + Extremities: no edema, no cyanosis    The results of significant diagnostics from this hospitalization (including imaging, microbiology, ancillary and laboratory) are listed below for reference.     Microbiology: No results found for this or any previous visit (from the past 240 hour(s)).   Labs: BNP (last 3 results) No results for input(s): BNP in the last 8760 hours. Basic Metabolic Panel:  Recent Labs Lab 06/11/16 0120 06/12/16 0702  NA 138 140  K 3.7 4.3  CL 101 107  CO2 25 26  GLUCOSE 112* 96  BUN 15 13  CREATININE 1.24 1.03  CALCIUM 9.5 8.7*   Liver Function Tests:  Recent Labs Lab 06/11/16 0120 06/12/16 0702  AST 24 32  ALT 19 8*  ALKPHOS 82 59  BILITOT 1.3* 2.1*  PROT 7.7 6.3*  ALBUMIN 4.5 3.6    Recent Labs Lab 06/11/16 0120  LIPASE 40   No results for input(s): AMMONIA in the last 168 hours. CBC:  Recent Labs Lab 06/11/16 0120 06/12/16 0702  WBC 6.4 4.3  HGB 14.9 12.9*  HCT 45.9 41.2  MCV 83.6 85.7  PLT 141* 125*   Cardiac Enzymes: No results  for input(s): CKTOTAL, CKMB, CKMBINDEX, TROPONINI in the last 168 hours. BNP: Invalid input(s): POCBNP CBG: No results for input(s): GLUCAP in the last 168 hours. D-Dimer No results for input(s): DDIMER in the last 72 hours. Hgb A1c No results for input(s): HGBA1C in the last 72 hours. Lipid Profile No results for input(s): CHOL, HDL, LDLCALC, TRIG, CHOLHDL, LDLDIRECT in the last 72 hours. Thyroid function studies No results for input(s): TSH, T4TOTAL, T3FREE, THYROIDAB in the last 72 hours.  Invalid input(s): FREET3 Anemia work up No results for input(s): VITAMINB12, FOLATE, FERRITIN, TIBC, IRON, RETICCTPCT in the last 72 hours. Urinalysis    Component Value Date/Time   COLORURINE YELLOW 06/11/2016 0120   APPEARANCEUR CLEAR 06/11/2016 0120   LABSPEC 1.016 06/11/2016 0120   PHURINE 8.0 06/11/2016 0120   GLUCOSEU NEGATIVE 06/11/2016 0120   HGBUR NEGATIVE 06/11/2016 0120   BILIRUBINUR NEGATIVE 06/11/2016 0120   KETONESUR 20 (A)  06/11/2016 0120   PROTEINUR NEGATIVE 06/11/2016 0120   UROBILINOGEN 0.2 11/28/2013 0233   NITRITE NEGATIVE 06/11/2016 0120   LEUKOCYTESUR NEGATIVE 06/11/2016 0120   Sepsis Labs Invalid input(s): PROCALCITONIN,  WBC,  LACTICIDVEN Microbiology No results found for this or any previous visit (from the past 240 hour(s)).   Time coordinating discharge: Over 30 minutes  SIGNED:   Debbe Odea, MD  [facility] 06/13/2016, 5:16 PM Pager   If 7PM-7AM, please contact night-coverage [email or web address] Password [email or web address]

## 2016-07-01 ENCOUNTER — Other Ambulatory Visit: Payer: Self-pay | Admitting: General Surgery

## 2016-07-01 DIAGNOSIS — K56600 Partial intestinal obstruction, unspecified as to cause: Secondary | ICD-10-CM

## 2016-07-03 ENCOUNTER — Ambulatory Visit
Admission: RE | Admit: 2016-07-03 | Discharge: 2016-07-03 | Disposition: A | Discharge status: Home / Self Care | Payer: Commercial Managed Care - PPO | Source: Ambulatory Visit | Attending: General Surgery | Admitting: General Surgery

## 2016-07-03 DIAGNOSIS — K56600 Partial intestinal obstruction, unspecified as to cause: Secondary | ICD-10-CM

## 2016-07-03 MED ORDER — IOPAMIDOL (ISOVUE-300) INJECTION 61%
100.0000 mL | Freq: Once | INTRAVENOUS | Status: AC | PRN
  Administered 2016-07-03: 100 mL via INTRAVENOUS

## 2018-08-10 ENCOUNTER — Other Ambulatory Visit: Payer: Self-pay | Admitting: Gastroenterology

## 2018-09-19 ENCOUNTER — Other Ambulatory Visit (HOSPITAL_COMMUNITY)
Admission: RE | Admit: 2018-09-19 | Discharge: 2018-09-19 | Disposition: A | Discharge status: Home / Self Care | Payer: Commercial Managed Care - PPO | Source: Ambulatory Visit | Attending: Gastroenterology | Admitting: Gastroenterology

## 2018-09-19 DIAGNOSIS — Z20828 Contact with and (suspected) exposure to other viral communicable diseases: Secondary | ICD-10-CM | POA: Diagnosis not present

## 2018-09-19 DIAGNOSIS — Z01812 Encounter for preprocedural laboratory examination: Secondary | ICD-10-CM | POA: Diagnosis not present

## 2018-09-19 LAB — SARS CORONAVIRUS 2 (TAT 6-24 HRS): SARS Coronavirus 2: NEGATIVE

## 2018-09-21 ENCOUNTER — Other Ambulatory Visit: Payer: Self-pay

## 2018-09-21 ENCOUNTER — Encounter (HOSPITAL_COMMUNITY): Payer: Self-pay | Admitting: *Deleted

## 2018-09-22 ENCOUNTER — Other Ambulatory Visit: Payer: Self-pay

## 2018-09-22 ENCOUNTER — Ambulatory Visit (HOSPITAL_COMMUNITY)
Admission: RE | Admit: 2018-09-22 | Discharge: 2018-09-22 | Disposition: A | Discharge status: Home / Self Care | Payer: Commercial Managed Care - PPO | Attending: Gastroenterology | Admitting: Gastroenterology

## 2018-09-22 ENCOUNTER — Encounter (HOSPITAL_COMMUNITY)
Admission: RE | Disposition: A | Discharge status: Home / Self Care | Payer: Self-pay | Source: Home / Self Care | Attending: Gastroenterology

## 2018-09-22 ENCOUNTER — Encounter (HOSPITAL_COMMUNITY): Payer: Self-pay

## 2018-09-22 ENCOUNTER — Ambulatory Visit (HOSPITAL_COMMUNITY): Payer: Commercial Managed Care - PPO | Admitting: Anesthesiology

## 2018-09-22 DIAGNOSIS — Z1211 Encounter for screening for malignant neoplasm of colon: Secondary | ICD-10-CM | POA: Diagnosis not present

## 2018-09-22 DIAGNOSIS — K56699 Other intestinal obstruction unspecified as to partial versus complete obstruction: Secondary | ICD-10-CM | POA: Diagnosis not present

## 2018-09-22 DIAGNOSIS — Z8572 Personal history of non-Hodgkin lymphomas: Secondary | ICD-10-CM | POA: Diagnosis not present

## 2018-09-22 DIAGNOSIS — K5090 Crohn's disease, unspecified, without complications: Secondary | ICD-10-CM | POA: Insufficient documentation

## 2018-09-22 DIAGNOSIS — K633 Ulcer of intestine: Secondary | ICD-10-CM | POA: Diagnosis not present

## 2018-09-22 DIAGNOSIS — K5289 Other specified noninfective gastroenteritis and colitis: Secondary | ICD-10-CM | POA: Diagnosis not present

## 2018-09-22 HISTORY — DX: Personal history of urinary calculi: Z87.442

## 2018-09-22 HISTORY — DX: Unspecified abdominal pain: R10.9

## 2018-09-22 HISTORY — PX: COLONOSCOPY WITH PROPOFOL: SHX5780

## 2018-09-22 HISTORY — DX: Other complications of anesthesia, initial encounter: T88.59XA

## 2018-09-22 HISTORY — PX: BIOPSY: SHX5522

## 2018-09-22 SURGERY — COLONOSCOPY WITH PROPOFOL
Anesthesia: Monitor Anesthesia Care

## 2018-09-22 MED ORDER — PROPOFOL 10 MG/ML IV BOLUS
INTRAVENOUS | Status: DC | PRN
  Administered 2018-09-22: 20 mg via INTRAVENOUS

## 2018-09-22 MED ORDER — LIDOCAINE 2% (20 MG/ML) 5 ML SYRINGE
INTRAMUSCULAR | Status: DC | PRN
  Administered 2018-09-22: 60 mg via INTRAVENOUS

## 2018-09-22 MED ORDER — PROPOFOL 10 MG/ML IV BOLUS
INTRAVENOUS | Status: AC
  Filled 2018-09-22: qty 40

## 2018-09-22 MED ORDER — PROPOFOL 500 MG/50ML IV EMUL
INTRAVENOUS | Status: DC | PRN
  Administered 2018-09-22: 150 ug/kg/min via INTRAVENOUS

## 2018-09-22 MED ORDER — SODIUM CHLORIDE 0.9 % IV SOLN: INTRAVENOUS | Status: DC

## 2018-09-22 MED ORDER — PROPOFOL 10 MG/ML IV BOLUS
INTRAVENOUS | Status: AC
  Filled 2018-09-22: qty 20

## 2018-09-22 MED ORDER — LACTATED RINGERS IV SOLN
INTRAVENOUS | Status: DC
  Administered 2018-09-22: 09:00:00 via INTRAVENOUS

## 2018-09-22 SURGICAL SUPPLY — 22 items

## 2018-09-22 NOTE — Transfer of Care (Signed)
Immediate Anesthesia Transfer of Care Note  Patient: Eric Winters  Procedure(s) Performed: COLONOSCOPY WITH PROPOFOL (N/A ) BIOPSY  Patient Location: Endoscopy Unit  Anesthesia Type:MAC  Level of Consciousness: drowsy and responds to stimulation  Airway & Oxygen Therapy: Patient Spontanous Breathing and Patient connected to face mask oxygen  Post-op Assessment: Report given to RN, Post -op Vital signs reviewed and stable and Patient moving all extremities  Post vital signs: Reviewed and stable  Last Vitals:  Vitals Value Taken Time  BP    Temp    Pulse    Resp    SpO2      Last Pain:  Vitals:   09/22/18 0825  TempSrc: Oral  PainSc: 0-No pain         Complications: No apparent anesthesia complications

## 2018-09-22 NOTE — Anesthesia Preprocedure Evaluation (Addendum)
Anesthesia Evaluation  Patient identified by MRN, date of birth, ID band Patient awake    Reviewed: Allergy & Precautions, NPO status , Patient's Chart, lab work & pertinent test results  History of Anesthesia Complications Negative for: history of anesthetic complications  Airway Mallampati: II  TM Distance: >3 FB Neck ROM: Full    Dental  (+) Dental Advisory Given   Pulmonary neg pulmonary ROS,    Pulmonary exam normal        Cardiovascular (-) anginanegative cardio ROS Normal cardiovascular exam     Neuro/Psych negative neurological ROS  negative psych ROS   GI/Hepatic Neg liver ROS,  IBS Crohn's disease    Endo/Other  negative endocrine ROS  Renal/GU negative Renal ROS     Musculoskeletal negative musculoskeletal ROS (+)   Abdominal   Peds  Hematology  Non-Hodgkin's Lymphoma in remission    Anesthesia Other Findings   Reproductive/Obstetrics                            Anesthesia Physical Anesthesia Plan  ASA: II  Anesthesia Plan: MAC   Post-op Pain Management:    Induction: Intravenous  PONV Risk Score and Plan: 1 and Propofol infusion and Treatment may vary due to age or medical condition  Airway Management Planned: Natural Airway and Simple Face Mask  Additional Equipment: None  Intra-op Plan:   Post-operative Plan:   Informed Consent: I have reviewed the patients History and Physical, chart, labs and discussed the procedure including the risks, benefits and alternatives for the proposed anesthesia with the patient or authorized representative who has indicated his/her understanding and acceptance.       Plan Discussed with: CRNA and Anesthesiologist  Anesthesia Plan Comments:        Anesthesia Quick Evaluation

## 2018-09-22 NOTE — Op Note (Signed)
[facility] Patient Name: Eric Winters Procedure Date: 09/22/2018 MRN: [id number] Attending MD: Ronald Lobo , MD Date of Birth: 18-Jan-1971 CSN: [id number] Age: 48 Admit Type: Outpatient Procedure:                Colonoscopy Indications:              Screening for colorectal malignant neoplasm, Last                            colonoscopy: July 2010; also, chronic loose stools                            and longstanding ileitis characterized by ileal                            thickening on CT and h/o stenosis of ileo-cecal                            valve and cecal ulceration on colonoscopy 10 year                            ago, suggestive of Crohn's disease but never                            requiring treatment. Providers:                Ronald Lobo, MD, Glori Bickers, RN, Ashley Jacobs, RN, Cherylynn Ridges, Technician, Lajuana Carry, CRNA Referring MD:              Medicines:                Monitored Anesthesia Care Complications:            No immediate complications. Estimated Blood Loss:     Estimated blood loss was minimal. Procedure:                Pre-Anesthesia Assessment:                           - Prior to the procedure, a History and Physical                            was performed, and patient medications and                            allergies were reviewed. The patient's tolerance of                            previous anesthesia was also reviewed. The risks                            and benefits of the procedure and  the sedation                            options and risks were discussed with the patient.                            All questions were answered, and informed consent                            was obtained. Prior Anticoagulants: The patient has                            taken no previous anticoagulant or antiplatelet                            agents. ASA Grade Assessment: II -  A patient with                            mild systemic disease. After reviewing the risks                            and benefits, the patient was deemed in                            satisfactory condition to undergo the procedure.                           After obtaining informed consent, the colonoscope                            was passed under direct vision. Throughout the                            procedure, the patient's blood pressure, pulse, and                            oxygen saturations were monitored continuously. The                            [id number]-[id number] ([id number]) Olympus pediatric colonscope                            was introduced through the anus and advanced to the                            the cecum, identified by appendiceal orifice and                            ileocecal valve. The colonoscopy was performed                            without difficulty. The patient tolerated the  procedure well. The quality of the bowel                            preparation was good. Scope In: 9:31:49 AM Scope Out: 9:55:21 AM Scope Withdrawal Time: 0 hours 20 minutes 7 seconds  Total Procedure Duration: 0 hours 23 minutes 32 seconds  Findings:      The perianal and digital rectal examinations were normal. Pertinent       negatives include normal prostate (size, shape, and consistency).      The colon (entire examined portion) appeared normal. Biopsies for       histology were taken with a cold forceps from the entire colon for       evaluation of microscopic colitis in view of a history of chronic       diarrhea.      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 The terminal ileal mucosa could be visualized through the       stenotic opening, and appeared normal; random biopsies were obtained of       it. However, the lumen of the TI  seemed stenotic.      A localized patch (approx. 6 mm diameter) of mildly ulcerated mucosa was       found at the ileocecal valve. Biopsies were taken with a cold forceps       for histology.      The retroflexed view of the distal rectum and anal verge was normal and       showed no anal or rectal abnormalities.      There is no endoscopic evidence of diverticula, inflammation, mass or       polyps in the entire colon. Impression:               - Findings are similar to 10 years ago.                           - The entire examined colon is normal. Biopsied.                           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                           - Ulcerated mucosa at the ileocecal valve. Biopsied. Moderate Sedation:      This patient was sedated with monitored anesthesia care, not moderate       sedation. Recommendation:           - Await pathology results.                           - Continue present medications. Procedure Code(s):        --- Professional ---                           [phone], Colonoscopy, flexible; with biopsy, single                            or multiple Diagnosis Code(s):        ---  Professional ---                           [phone], Other intestinal obstruction unspecified                            as to partial versus complete obstruction                           K63.3, Ulcer of intestine                           Z12.11, Encounter for screening for malignant                            neoplasm of colon CPT copyright 2019 [employer]. All rights reserved. The codes documented in this report are preliminary and upon coder review may  be revised to meet current compliance requirements. Ronald Lobo, MD 09/22/2018 10:07:28 AM This report has been signed electronically. Number of Addenda: 0

## 2018-09-22 NOTE — H&P (Signed)
Eric Winters is an 48 y.o. male.   Chief Complaint: Colon cancer screening HPI: 48 year old with possible Crohn's ileitis (never treated) and abdominal lymphoma ((stable off treatment for the past 6 years) with periodic obstructive episodes and chronic diarrhea.  His last colonoscopy, 10 years ago, showed stenosis of the ileocecal valve, but no polyps or colitis.  Past Medical History:  Diagnosis Date  . Complication of anesthesia    woke up during coloscopy with mod sedation, needs to lay on left side hx intestinal blockage  . Crohn disease ([place])   . History of kidney stones yrs ago  . IBS (irritable bowel syndrome)   . Intestinal cramps    hx of blockage   . Non Hodgkin's lymphoma ([place])    remission since 2-14    Past Surgical History:  Procedure Laterality Date  . EXPLORATORY LAPAROTOMY WITH ABDOMINAL MASS EXCISION  2003   Exploratory laparotomy with excisional biopsy of mesenteric lymph  . EYE SURGERY     straighten eyes out, laser sx left eye    History reviewed. No pertinent family history. Social History:  reports that he has never smoked. He has never used smokeless tobacco. He reports current alcohol use. He reports that he does not use drugs.  Allergies:  Allergies  Allergen Reactions  . Dilaudid [Hydromorphone Hcl] Nausea And Vomiting  . Penicillins Hives    Did it involve swelling of the face/tongue/throat, SOB, or low BP? No Did it involve sudden or severe rash/hives, skin peeling, or any reaction on the inside of your mouth or nose? No Did you need to seek medical attention at a hospital or doctor's office? Yes When did it last happen?been a long time  If all above answers are "NO", may proceed with cephalosporin use.   . Latex Rash    No medications prior to admission.    No results found for this or any previous visit (from the past 48 hour(s)). No results found.  ROS see HPI  Blood pressure (!) 142/85, pulse 76, temperature 97.7 F (36.5 C),  temperature source Oral, resp. rate 16, height 5' 11"  (1.803 m), weight 98 kg, SpO2 99 %. Physical Exam pleasant, healthy-appearing, cognitively intact, no pallor or icterus, chest clear, heart without murmur or arrhythmia, abdomen without mass or tenderness, no evident focal neurologic findings.  Assessment/Plan Proceed to colonoscopy, probably with biopsies of TI if possible, as well as random biopsies of colon in view of frequent bowel movements.  Cleotis Nipper, MD 09/22/2018, 9:23 AM

## 2018-09-22 NOTE — Anesthesia Postprocedure Evaluation (Signed)
Anesthesia Post Note  Patient: Eric Winters  Procedure(s) Performed: COLONOSCOPY WITH PROPOFOL (N/A ) BIOPSY     Patient location during evaluation: PACU Anesthesia Type: MAC Level of consciousness: awake and alert Pain management: pain level controlled Vital Signs Assessment: post-procedure vital signs reviewed and stable Respiratory status: spontaneous breathing, nonlabored ventilation and respiratory function stable Cardiovascular status: stable and blood pressure returned to baseline Anesthetic complications: no    Last Vitals:  Vitals:   09/22/18 1010 09/22/18 1021  BP: 110/69 114/63  Pulse: 60 65  Resp: 20 20  Temp:    SpO2: 96% 95%    Last Pain:  Vitals:   09/22/18 1021  TempSrc:   PainSc: 0-No pain                 Audry Pili

## 2018-09-23 ENCOUNTER — Encounter (HOSPITAL_COMMUNITY): Payer: Self-pay | Admitting: Gastroenterology

## 2019-11-10 ENCOUNTER — Other Ambulatory Visit: Payer: Self-pay | Admitting: Surgical Oncology

## 2019-11-10 DIAGNOSIS — I898 Other specified noninfective disorders of lymphatic vessels and lymph nodes: Secondary | ICD-10-CM

## 2019-11-16 ENCOUNTER — Encounter: Payer: Self-pay | Admitting: *Deleted

## 2019-11-16 ENCOUNTER — Ambulatory Visit
Admission: RE | Admit: 2019-11-16 | Discharge: 2019-11-16 | Disposition: A | Discharge status: Home / Self Care | Payer: Commercial Managed Care - PPO | Source: Ambulatory Visit | Attending: Surgical Oncology | Admitting: Surgical Oncology

## 2019-11-16 DIAGNOSIS — I898 Other specified noninfective disorders of lymphatic vessels and lymph nodes: Secondary | ICD-10-CM

## 2019-11-16 HISTORY — PX: IR RADIOLOGIST EVAL & MGMT: IMG5224

## 2019-11-16 NOTE — Consult Note (Signed)
Referring Physician(s): Beverely Pace  Chief Complaint: The patient is seen for consultation regarding a left axillary lymphocele   History of present illness: Eric Winters. 65, 49 year old male, has a medical history significant for Non-Hodgkin's lymphoma. He was first diagnosed in 2002 but did not require therapy until 2014. He achieved remission in 6 months. In August 2021 he developed abdominal distention and imaging was positive for diffuse lymphadenopathy and splenomegaly. He had an incisional lymph node biopsy October 10, 2019 along with port-a-catheter placement done by Dr. Adair Laundry in [place], [place]. He subsequently developed a fluid collection at the site of the biopsy which drained continuously and formed a palpable area of tenderness. Dr. Adair Laundry attempted to aspirate this fluid collection in the office but was unsuccessful.   Eric Winters presented to the Interventional Radiology department at [facility] 11/07/19 for an image-guided aspiration with possible drain placement with Dr. Kathlene Cote who was able to aspirate 12-13 ml of serosanguineous fluid. A drain was not placed.   The patient continues to have fluid draining from this site and there is concern for a possible lymphocele. A repeat ultrasound of the area was done 11/15/19 which showed, "A complex, irregular hypoechoic collection in the left axilla measures 3.2 x 1.8 x 1.2 cm, smaller than on the prior study. There is a tract extending to the skin."  Eric Winters presents today to the [facility] Radiology clinic for further evaluation. He states he is tired from chemotherapy and has difficulty walking due to his gout. He has been afebrile.   Past Medical History:  Diagnosis Date  . Complication of anesthesia    woke up during coloscopy with mod sedation, needs to lay on left side hx intestinal blockage  . Crohn disease ([place])   . History of kidney stones yrs ago  . IBS (irritable bowel syndrome)   . Intestinal  cramps    hx of blockage   . Non Hodgkin's lymphoma ([place])    remission since 2-14    Past Surgical History:  Procedure Laterality Date  . BIOPSY  09/22/2018   Procedure: BIOPSY;  Surgeon: Ronald Lobo, MD;  Location: [facility];  Service: Endoscopy;;  . COLONOSCOPY WITH PROPOFOL N/A 09/22/2018   Procedure: COLONOSCOPY WITH PROPOFOL;  Surgeon: Ronald Lobo, MD;  Location: [facility];  Service: Endoscopy;  Laterality: N/A;  . EXPLORATORY LAPAROTOMY WITH ABDOMINAL MASS EXCISION  2003   Exploratory laparotomy with excisional biopsy of mesenteric lymph  . EYE SURGERY     straighten eyes out, laser sx left eye  . IR RADIOLOGIST EVAL & MGMT  11/16/2019    Allergies: Dilaudid [hydromorphone hcl], Penicillins, and Latex  Medications: Prior to Admission medications   Not on File     No family history on file.  Social History   Socioeconomic History  . Marital status: Married    Spouse name: Not on file  . Number of children: Not on file  . Years of education: Not on file  . Highest education level: Not on file  Occupational History  . Not on file  Tobacco Use  . Smoking status: Never Smoker  . Smokeless tobacco: Never Used  Vaping Use  . Vaping Use: Never used  Substance and Sexual Activity  . Alcohol use: Yes    Comment: rarely  . Drug use: No  . Sexual activity: Not on file  Other Topics Concern  . Not on file  Social History Narrative  . Not on file   Social  Determinants of Health   Financial Resource Strain:   . Difficulty of Paying Living Expenses: Not on file  Food Insecurity:   . Worried About Charity fundraiser in the Last Year: Not on file  . Ran Out of Food in the Last Year: Not on file  Transportation Needs:   . Lack of Transportation (Medical): Not on file  . Lack of Transportation (Non-Medical): Not on file  Physical Activity:   . Days of Exercise per Week: Not on file  . Minutes of Exercise per Session: Not on file  Stress:   . Feeling  of Stress : Not on file  Social Connections:   . Frequency of Communication with Friends and Family: Not on file  . Frequency of Social Gatherings with Friends and Family: Not on file  . Attends Religious Services: Not on file  . Active Member of Clubs or Organizations: Not on file  . Attends Archivist Meetings: Not on file  . Marital Status: Not on file     Vital Signs: There were no vitals taken for this visit.  Physical Exam Constitutional:      General: He is not in acute distress. Cardiovascular:     Comments: Right upper chest port-a-catheter Pulmonary:     Effort: Pulmonary effort is normal.  Abdominal:     General: There is distension.  Musculoskeletal:     Comments: Gout in both feet; some difficulty with ambulation but does not require an assistive device.   Skin:    General: Skin is warm and dry.     Findings: Wound present.     Comments: Left axillary incision from lymph node biopsy, approximately 2 inches long, well approximated. Mild erythema around the site, no active draining. Scant amount of clear, light yellow fluid on dressing. Circumferential area of moderate firmness around incision. Mild tenderness to palpation.   Neurological:     Mental Status: He is alert and oriented to person, place, and time.   Imaging: IR Radiologist Eval & Mgmt  Result Date: 11/16/2019 Please refer to notes tab for details about interventional procedure. (Op Note)  Labs:  CBC: No results for input(s): WBC, HGB, HCT, PLT in the last 8760 hours.  COAGS: No results for input(s): INR, APTT in the last 8760 hours.  BMP: No results for input(s): NA, K, CL, CO2, GLUCOSE, BUN, CALCIUM, CREATININE, GFRNONAA, GFRAA in the last 8760 hours.  Invalid input(s): CMP  LIVER FUNCTION TESTS: No results for input(s): BILITOT, AST, ALT, ALKPHOS, PROT, ALBUMIN in the last 8760 hours.  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 Serafina Royals today to discuss various treatment options regarding this persistent area of drainage and tenderness. The patient states the amount of drainage has decreased significantly and the area of palpable firmness/tenderness is also declining. The incision is mostly healed with well-approximated edges. No drainage observed during this assessment and the patient endorses only mild tenderness to palpation. It was discussed that lymphoceles can sometimes heal on their own but the process may take several months. The recommendation was made to watch and wait and the patient is in agreement with this plan. Dr. Serafina Royals will follow up with Eric Winters via a telephone call in approximately two weeks to check on him and see how the area is healing. An order has been placed for a scheduler with [facility] to call Eric Winters to arrange a date and time. Eric Winters was  also given the number to our schedulers so he could call them if he has not been contacted by tomorrow or Friday.     Signed: Theresa Duty, NP 11/16/2019, 3:25 PM   Please refer to Dr. Malachi Carl attestation of this note for management and plan.

## 2019-11-30 ENCOUNTER — Ambulatory Visit
Admission: RE | Admit: 2019-11-30 | Discharge: 2019-11-30 | Disposition: A | Discharge status: Home / Self Care | Payer: Commercial Managed Care - PPO | Source: Ambulatory Visit | Attending: Student | Admitting: Student

## 2019-11-30 ENCOUNTER — Other Ambulatory Visit: Payer: Self-pay

## 2019-11-30 ENCOUNTER — Other Ambulatory Visit: Payer: Self-pay | Admitting: *Deleted

## 2019-11-30 ENCOUNTER — Encounter: Payer: Self-pay | Admitting: *Deleted

## 2019-11-30 DIAGNOSIS — I898 Other specified noninfective disorders of lymphatic vessels and lymph nodes: Secondary | ICD-10-CM

## 2019-11-30 DIAGNOSIS — L02419 Cutaneous abscess of limb, unspecified: Secondary | ICD-10-CM

## 2019-11-30 HISTORY — PX: IR RADIOLOGIST EVAL & MGMT: IMG5224

## 2019-11-30 NOTE — Progress Notes (Signed)
Referring Physician(s): Beverely Pace  Chief Complaint: The patient is seen in follow up today for history of left axillary fluid collection  History of present illness: 49 year old male with history of non-Hodgkin's lymphoma status post left axillary excisional biopsy on 08/04/92 complicated post-operatively by an axillary fluid collection, status post ultrasound guided percutaneous drainage on 11/07/19 yielding 12 mL serosanguinous fluid.  Prior to aspiration the collection measured up to approximately 6 cm, which decreased to approximately 3 cm on 11/15/19 ultrasound which was prompted by persistent fluid draining through the skin from the collection.    He was seen in IR clinic on 11/16/19 with reports of decreased drainage and decreased tenderness.  He presents via telephone clinic visit today for follow up.  He states that the drainage has continued but slowed significantly.  He still notices an opening in the skin at the incision site where fluid has been draining.  The fluid remains translucent and yellow.  No bleeding or purulence.  Mild surrounding redness.  He feels like the opening is worsened by lifting his arm over his head.  No fevers, chills.  He continues to get paracenteses, now increasing frequency from weekly to twice a week.  He undergoes his third session of chemotherapy this week.    Past Medical History:  Diagnosis Date  . Complication of anesthesia    woke up during coloscopy with mod sedation, needs to lay on left side hx intestinal blockage  . Crohn disease ([place])   . History of kidney stones yrs ago  . IBS (irritable bowel syndrome)   . Intestinal cramps    hx of blockage   . Non Hodgkin's lymphoma ([place])    remission since 2-14    Past Surgical History:  Procedure Laterality Date  . BIOPSY  09/22/2018   Procedure: BIOPSY;  Surgeon: Ronald Lobo, MD;  Location: [facility];  Service: Endoscopy;;  . COLONOSCOPY WITH PROPOFOL N/A 09/22/2018   Procedure:  COLONOSCOPY WITH PROPOFOL;  Surgeon: Ronald Lobo, MD;  Location: [facility];  Service: Endoscopy;  Laterality: N/A;  . EXPLORATORY LAPAROTOMY WITH ABDOMINAL MASS EXCISION  2003   Exploratory laparotomy with excisional biopsy of mesenteric lymph  . EYE SURGERY     straighten eyes out, laser sx left eye  . IR RADIOLOGIST EVAL & MGMT  11/16/2019    Allergies: Dilaudid [hydromorphone hcl], Penicillins, and Latex  Medications: Prior to Admission medications   Not on File     No family history on file.  Social History   Socioeconomic History  . Marital status: Married    Spouse name: Not on file  . Number of children: Not on file  . Years of education: Not on file  . Highest education level: Not on file  Occupational History  . Not on file  Tobacco Use  . Smoking status: Never Smoker  . Smokeless tobacco: Never Used  Vaping Use  . Vaping Use: Never used  Substance and Sexual Activity  . Alcohol use: Yes    Comment: rarely  . Drug use: No  . Sexual activity: Not on file  Other Topics Concern  . Not on file  Social History Narrative  . Not on file   Social Determinants of Health   Financial Resource Strain:   . Difficulty of Paying Living Expenses: Not on file  Food Insecurity:   . Worried About Charity fundraiser in the Last Year: Not on file  . Ran Out of Food in the Last  Year: Not on file  Transportation Needs:   . Lack of Transportation (Medical): Not on file  . Lack of Transportation (Non-Medical): Not on file  Physical Activity:   . Days of Exercise per Week: Not on file  . Minutes of Exercise per Session: Not on file  Stress:   . Feeling of Stress : Not on file  Social Connections:   . Frequency of Communication with Friends and Family: Not on file  . Frequency of Social Gatherings with Friends and Family: Not on file  . Attends Religious Services: Not on file  . Active Member of Clubs or Organizations: Not on file  . Attends Archivist  Meetings: Not on file  . Marital Status: Not on file     Vital Signs: There were no vitals taken for this visit.   Assessment and Plan: 49 year old male with history of non-Hodgkin's lymphoma status post left axillary incisional lymph node biopsy on 7/34/28 complicated post-operatively by axillary fluid collection.  The collection has decreased in size, with less percutaneous drainage.  No signs of infection.  Still uncertain if this is post-operative seroma versus lymphorrhea.  Plan for follow up left axillary ultrasound in 2-4 weeks, preferably in coordination with an upcoming paracentesis.   Will arrange for telephone visit after ultrasound is complete for re-assessment.  Electronically Signed: Rosanne Ashing Palmas 11/30/2019, 8:25 AM   I spent a total of 15 Minutes in telephone clinical consultation, greater than 50% of which was counseling/coordinating care for left axillary fluid collection.

## 2019-12-07 ENCOUNTER — Other Ambulatory Visit: Payer: Self-pay | Admitting: Interventional Radiology

## 2019-12-07 DIAGNOSIS — L02419 Cutaneous abscess of limb, unspecified: Secondary | ICD-10-CM

## 2019-12-19 ENCOUNTER — Ambulatory Visit
Admission: RE | Admit: 2019-12-19 | Discharge: 2019-12-19 | Disposition: A | Discharge status: Home / Self Care | Payer: Commercial Managed Care - PPO | Source: Ambulatory Visit | Attending: Interventional Radiology | Admitting: Interventional Radiology

## 2019-12-19 ENCOUNTER — Encounter: Payer: Self-pay | Admitting: *Deleted

## 2019-12-19 ENCOUNTER — Other Ambulatory Visit: Payer: Self-pay

## 2019-12-19 DIAGNOSIS — L02419 Cutaneous abscess of limb, unspecified: Secondary | ICD-10-CM

## 2019-12-19 HISTORY — PX: IR RADIOLOGIST EVAL & MGMT: IMG5224

## 2019-12-19 NOTE — Progress Notes (Signed)
Referring Physician(s): Beverely Pace  Chief Complaint: The patient is seen in follow up today for possible left axillary lymphocele  History of present illness: 49 year old male with history of non-Hodgkin's lymphoma status post left axillary excisional biopsy on 2/80/03 complicated post-operatively by an axillary fluid collection, status post ultrasound guided percutaneous drainage on 11/07/19 yielding 12 mL serosanguinous fluid.  Prior to aspiration the collection measured up to approximately 6 cm, which decreased to approximately 3 cm on 11/15/19 ultrasound which was prompted by persistent fluid draining through the skin from the collection.    He was seen in IR clinic on 11/16/19 with reports of decreased drainage and decreased tenderness, followed by 11/30/19 with continued decrease in drainage.  He had a left axillary ultrasound on 12/15/19, which demonstrated near complete resolution of the fluid collection.  He states that he has had no drainage from the area in 3 weeks, but the wound itself still opens some and is bothersome and difficult to heal.  Past Medical History:  Diagnosis Date  . Complication of anesthesia    woke up during coloscopy with mod sedation, needs to lay on left side hx intestinal blockage  . Crohn disease ([place])   . History of kidney stones yrs ago  . IBS (irritable bowel syndrome)   . Intestinal cramps    hx of blockage   . Non Hodgkin's lymphoma ([place])    remission since 2-14    Past Surgical History:  Procedure Laterality Date  . BIOPSY  09/22/2018   Procedure: BIOPSY;  Surgeon: Ronald Lobo, MD;  Location: [facility];  Service: Endoscopy;;  . COLONOSCOPY WITH PROPOFOL N/A 09/22/2018   Procedure: COLONOSCOPY WITH PROPOFOL;  Surgeon: Ronald Lobo, MD;  Location: [facility];  Service: Endoscopy;  Laterality: N/A;  . EXPLORATORY LAPAROTOMY WITH ABDOMINAL MASS EXCISION  2003   Exploratory laparotomy with excisional biopsy of mesenteric lymph  .  EYE SURGERY     straighten eyes out, laser sx left eye  . IR RADIOLOGIST EVAL & MGMT  11/16/2019  . IR RADIOLOGIST EVAL & MGMT  11/30/2019    Allergies: Dilaudid [hydromorphone hcl], Penicillins, and Latex  Medications: Prior to Admission medications   Not on File     No family history on file.  Social History   Socioeconomic History  . Marital status: Married    Spouse name: Not on file  . Number of children: Not on file  . Years of education: Not on file  . Highest education level: Not on file  Occupational History  . Not on file  Tobacco Use  . Smoking status: Never Smoker  . Smokeless tobacco: Never Used  Vaping Use  . Vaping Use: Never used  Substance and Sexual Activity  . Alcohol use: Yes    Comment: rarely  . Drug use: No  . Sexual activity: Not on file  Other Topics Concern  . Not on file  Social History Narrative  . Not on file   Social Determinants of Health   Financial Resource Strain:   . Difficulty of Paying Living Expenses: Not on file  Food Insecurity:   . Worried About Charity fundraiser in the Last Year: Not on file  . Ran Out of Food in the Last Year: Not on file  Transportation Needs:   . Lack of Transportation (Medical): Not on file  . Lack of Transportation (Non-Medical): Not on file  Physical Activity:   . Days of Exercise per Week: Not on file  .  Minutes of Exercise per Session: Not on file  Stress:   . Feeling of Stress : Not on file  Social Connections:   . Frequency of Communication with Friends and Family: Not on file  . Frequency of Social Gatherings with Friends and Family: Not on file  . Attends Religious Services: Not on file  . Active Member of Clubs or Organizations: Not on file  . Attends Archivist Meetings: Not on file  . Marital Status: Not on file     Vital Signs: There were no vitals taken for this visit.  No physical exam as this was a telephone visit.  Imaging: No results  found.  Labs:  CBC: No results for input(s): WBC, HGB, HCT, PLT in the last 8760 hours.  COAGS: No results for input(s): INR, APTT in the last 8760 hours.  BMP: No results for input(s): NA, K, CL, CO2, GLUCOSE, BUN, CALCIUM, CREATININE, GFRNONAA, GFRAA in the last 8760 hours.  Invalid input(s): CMP  LIVER FUNCTION TESTS: No results for input(s): BILITOT, AST, ALT, ALKPHOS, PROT, ALBUMIN in the last 8760 hours.  Assessment and Plan: 49 year old male with history of non-Hodgkin's lymphoma status post left axillary incisional lymph node biopsy on 5/85/92 complicated post-operatively by axillary fluid collection.  The collection has now essentially resolved, measuring up to 4 mm on recent ultrasound, with no drainage from the incision site in 3 weeks.  Follow up in IR clinic if fluid collection returns or as needed.  Electronically Signed: Suzette Battiest 12/19/2019, 12:59 PM   I spent a total of 10 Minutes in face to face in clinical consultation, greater than 50% of which was counseling/coordinating care for left axillary fluid collection.

## 2020-01-02 ENCOUNTER — Other Ambulatory Visit: Payer: Self-pay | Admitting: Physician Assistant

## 2020-01-02 DIAGNOSIS — I898 Other specified noninfective disorders of lymphatic vessels and lymph nodes: Secondary | ICD-10-CM

## 2020-02-23 ENCOUNTER — Ambulatory Visit
Admission: RE | Admit: 2020-02-23 | Discharge: 2020-02-23 | Disposition: A | Discharge status: Home / Self Care | Payer: Commercial Managed Care - PPO | Source: Ambulatory Visit | Attending: Physician Assistant | Admitting: Physician Assistant

## 2020-02-23 ENCOUNTER — Encounter: Payer: Self-pay | Admitting: *Deleted

## 2020-02-23 DIAGNOSIS — I898 Other specified noninfective disorders of lymphatic vessels and lymph nodes: Secondary | ICD-10-CM

## 2020-02-23 HISTORY — PX: IR RADIOLOGIST EVAL & MGMT: IMG5224

## 2020-02-23 NOTE — Consult Note (Signed)
Chief Complaint: Chylous Ascites  Referring Physician(s): Dr. Cruzita Lederer, [facility]  History of Present Illness: Eric Winters is a 50 y.o. male presenting as a scheduled consultation to [facility], kindly referred by Dr. 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   Eric Winters is here today with his wife for our clinic visit.   He tells me that the diagnosis of lymphoma was made in early 2000, perhaps 2003, and active surveillance strategy was employed until about 2014.  In 2014 he had progression, and therapy was initiated.  This was ~6 cycles of chemotherapy, and then observation.  He was doing well until last summer, when he noticed some abdominal distension.  He came back to Dr. Cruzita Lederer for evaluation and PET was performed 10/04/19, confirming lymphoma.  Treatment was initiated.    Additionally, he had new ascites.  Large volume paracentesis was initiated, with the first 10/13/19.  He required multiple drainage in September and October, at least weekly, with LVP each time.  Eventually the fluid was sent for triglycerides, and the triglycerides were positive, greater than 700, confirming chylous ascites.   Regarding his risk factors for chylous ascites, he is not cirrhotic.  He had a mesenteric biopsy performed in the [facility] system by Dr. Margot Chimes 09/02/2001, at time of his initial diagnosis.  He has had mesenteric involvement of lymphoma, with his CT scans showing extensive mesenteric lymphadenopathy.  No history of renal, bowel, liver, pancreatic surgery.    He does have, also, a history of Crohns disease, with diagnostic biopsy performed originally by Dr. [name] on colonoscopy 08/25/2001.    Today, Eric Winters tells me that his bowel habits have been variable, with both constipation/hard stools, and diarrhea.  Recently, he has not had any diarrhea.  He has, clearly, been following the dietary  restrictions prescribed by nutrition team for treatment of the chylous ascites.  He describes his bland diet in detail, whereby he says he is taking in 5g or less of fat per day.  He admits to the rare indiscretion of fried food intake.    From what I can tell, he has not been using TPN strategy, and has not been prescribed somatostatins.    He tells me that he does all of his ADL's, including bathing, cleaning, dressing, etc.  He does tell me that he spends most of the day on the couch, >50% because of fatigue.    Currently he is in remission, with strategy of upcoming maintenance therapy initiation.  He is concerned this will decrease his WBC.   Past Medical History:  Diagnosis Date  . Complication of anesthesia    woke up during coloscopy with mod sedation, needs to lay on left side hx intestinal blockage  . Crohn disease ([place])   . History of kidney stones yrs ago  . IBS (irritable bowel syndrome)   . Intestinal cramps    hx of blockage   . Non Hodgkin's lymphoma ([place])    remission since 2-14    Past Surgical History:  Procedure Laterality Date  . BIOPSY  09/22/2018   Procedure: BIOPSY;  Surgeon: Ronald Lobo, MD;  Location: [facility];  Service: Endoscopy;;  . COLONOSCOPY WITH PROPOFOL N/A 09/22/2018   Procedure: COLONOSCOPY WITH PROPOFOL;  Surgeon: Ronald Lobo, MD;  Location: [facility];  Service: Endoscopy;  Laterality: N/A;  . EXPLORATORY LAPAROTOMY WITH ABDOMINAL MASS EXCISION  2003   Exploratory laparotomy with excisional  biopsy of mesenteric lymph  . EYE SURGERY     straighten eyes out, laser sx left eye  . IR RADIOLOGIST EVAL & MGMT  11/16/2019  . IR RADIOLOGIST EVAL & MGMT  11/30/2019  . IR RADIOLOGIST EVAL & MGMT  12/19/2019  . IR RADIOLOGIST EVAL & MGMT  02/23/2020    Allergies: Dilaudid [hydromorphone hcl], Penicillins, and Latex  Medications: Prior to Admission medications   Not on File     No family history on file.  Social History    Socioeconomic History  . Marital status: Married    Spouse name: Not on file  . Number of children: Not on file  . Years of education: Not on file  . Highest education level: Not on file  Occupational History  . Not on file  Tobacco Use  . Smoking status: Never Smoker  . Smokeless tobacco: Never Used  Vaping Use  . Vaping Use: Never used  Substance and Sexual Activity  . Alcohol use: Yes    Comment: rarely  . Drug use: No  . Sexual activity: Not on file  Other Topics Concern  . Not on file  Social History Narrative  . Not on file   Social Determinants of Health   Financial Resource Strain: Not on file  Food Insecurity: Not on file  Transportation Needs: Not on file  Physical Activity: Not on file  Stress: Not on file  Social Connections: Not on file    ECOG Status: 3 - Symptomatic, >50% confined to bed  Review of Systems: A 12 point ROS discussed and pertinent positives are indicated in the HPI above.  All other systems are negative.  Review of Systems  Vital Signs: There were no vitals taken for this visit.  Physical Exam General: 50 yo male appearing stated age.  Well-developed, well-nourished.  No distress, fatigued. HEENT: Atraumatic, normocephalic.  Bald.    extra-ocular motor intact. No scleral icterus or scleral injection. No lesions on external ears, nose, lips, or gums.  Oral mucosa moist, pink.  Neck: Symmetric with no goiter enlargement.  Chest/Lungs:  Symmetric chest with inspiration/expiration.  No labored breathing.  Clear to auscultation with no wheezes, rhonchi, or rales.  Heart:  RRR, with no third heart sounds appreciated. No JVD appreciated.  Abdomen:  Soft, NT/ND, with + bowel sounds.  No fluid wave appreciated.  Dry bandage on the right abd from LVP today. Midline scar supra-umbilical, from prior surgical bx.  Genito-urinary: Deferred Neurologic: Alert & Oriented to person, place, and time.   Normal affect and insight.  Appropriate  questions.  Moving all 4 extremities with gross sensory intact.   Extremities: No wound.  Capillary refill prompt.  No edema.     Mallampati Score:     Imaging: IR Radiologist Eval & Mgmt  Result Date: 02/23/2020 Please refer to notes tab for details about interventional procedure. (Op Note)   Labs:  CBC: No results for input(s): WBC, HGB, HCT, PLT in the last 8760 hours.  COAGS: No results for input(s): INR, APTT in the last 8760 hours.  BMP: No results for input(s): NA, K, CL, CO2, GLUCOSE, BUN, CALCIUM, CREATININE, GFRNONAA, GFRAA in the last 8760 hours.  Invalid input(s): CMP  LIVER FUNCTION TESTS: No results for input(s): BILITOT, AST, ALT, ALKPHOS, PROT, ALBUMIN in the last 8760 hours.  TUMOR MARKERS: No results for input(s): AFPTM, CEA, CA199, CHROMGRNA in the last 8760 hours.  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   His ascites has been refractory to conservative management, with about 4-6 weeks now of low fat diet.    I had a lengthy discussion with him and his wife regarding lymphatics, role of peripheral lymphatics, and specifically the role of small bowel lymphatics for digestion and why these are implicated in his chylous ascites.   I did share with them that his 2 risk factors are the treated mesenteric lymphoma and the prior mesenteric lymph node resection.  My suspicion is that the etiology, for him, is not the prior surgery, but the history of lymphoma.    We further discussed treatment, which he has already initiated with dietary changes including medium chain triglycerides (MCT)/low fat diet.  He has not tried somatostatins or TPN.  I did let him know that the literature describes possible successful healing with diet modification/conservative management anywhere from 6 weeks to 12 months.    We also discussed the logistics of lymphangiogram and  possible embolization.  We talked about the imaging of the lymphatics of abdomen/pelvis with Xray guidance, and then the possibility of performing further fenestration and/or embolization should we identify a leak.   I let them know that, generally speaking, the success rates for healing a lymphatic leak such as this are:  - lymphangiogram with lipiodol:  ~30-50% - lymphangiogram with targeted needle fenestration: up to ~70% - lymphangiogram with ID of the leak site and embolization: up to 90%  I also let them know that the odds are not in our favor in this scenario, as the leak is presumably from the efferent lymph network of the small bowel/ileum, which is hard to opacify with traditional lymphangiogram.  The center with the most experience, [facility], has published an expected success rate of ~36% rate in the scenario of non-traumatic, non-post-surgical chylous ascites.    We also discussed the risk profile, which is fairly benign, with risks of bleeding, infection, local injury, need for hospitalization, worsening of diarrhea, swelling, anesthesia risk, allergic reaction, pulmonary embolus, cardiopulmonary collapse, death.  Generally, this procedure is considered quite safe with overall lack of complications.   After our discussion, they would not like to continue conservative management any longer than possible, and are decisive that they would like to try treatment with lymphangiogram and possible embolization.   Plan: - Plan for lymphangiogram and possible embolization at [facility], with Dr. Earleen Newport.  Anticipate 3 hours with General Anesthesia - Continue current care     Electronically Signed: Corrie Mckusick 02/23/2020, 4:03 PM   I spent a total of    40 Minutes in face to face in clinical consultation, greater than 50% of which was counseling/coordinating care for chylous ascites, possible lymphangiogram and possible embolization

## 2020-03-28 ENCOUNTER — Other Ambulatory Visit: Payer: Self-pay | Admitting: Interventional Radiology

## 2020-03-28 DIAGNOSIS — I898 Other specified noninfective disorders of lymphatic vessels and lymph nodes: Secondary | ICD-10-CM

## 2020-04-30 ENCOUNTER — Encounter: Payer: Self-pay | Admitting: *Deleted

## 2020-04-30 ENCOUNTER — Ambulatory Visit
Admission: RE | Admit: 2020-04-30 | Discharge: 2020-04-30 | Disposition: A | Discharge status: Home / Self Care | Payer: Commercial Managed Care - PPO | Source: Ambulatory Visit | Attending: Interventional Radiology | Admitting: Interventional Radiology

## 2020-04-30 DIAGNOSIS — I898 Other specified noninfective disorders of lymphatic vessels and lymph nodes: Secondary | ICD-10-CM

## 2020-04-30 HISTORY — PX: IR RADIOLOGIST EVAL & MGMT: IMG5224

## 2020-04-30 NOTE — Progress Notes (Signed)
Chief Complaint: Chylous Ascites  Referring Physician(s): Dr. Cruzita Lederer, [facility]  History of Present Illness: Eric Winters is a 50 y.o. male presenting as a scheduled follow up to [place] [facility], SP attempt of abdominal lymphangiogram/embolization.   Eric Eric Winters joins [place] today by telemedicine visit, given ongoing COVID situation.  We confirmed his identity with 2 personal identifiers.    We first met him in the clinic to discuss 02/23/2020  HPI:  He had diagnosis of lymphoma in early 2000, perhaps 2003, and active surveillance strategy was employed until about 2014.  In 2014 he had progression, and therapy was initiated.  This was ~6 cycles of chemotherapy, and then observation.  He was doing well until last summer, when he noticed some abdominal distension.  He came back to Dr. Cruzita Lederer for evaluation and PET was performed 10/04/19, confirming lymphoma.  Treatment was initiated.    He developed ascites, with initiation of large volume paracentesis  first 10/13/19.  He has required at least weekly LVP.  Fluid was positive for triglycerides, greater than 700, confirming chylous ascites.   Regarding his risk factors for chylous ascites, he is not cirrhotic.  He had a mesenteric biopsy performed in the [facility] by Dr. Margot Chimes 09/02/2001, at time of his initial diagnosis.  He has had mesenteric involvement of lymphoma, with his CT scans showing extensive mesenteric lymphadenopathy.  No history of renal, bowel, liver, pancreatic surgery.     Interval Hx: On 03/26/20 at [place], we performed bilateral inguinal lymph node US guided access for pelvic lymphangiogram, image guided abdominal node access, and lipiodol sclerosis of the lymph system at the site of prior abdominal node biopsy (identified by surgery clips).   Since then, he has continued LVP's, with ~5 performed, ranging in volume 3-5L.  The fluid has been described as more clear.   Since  then, Eric Winters tells me that he continues the low-fat diet/ bland diet, whereby he says he is taking in 5g or less of fat per day.  He complains of some fatigue.  No significant abdominal discomfort.    He did have some bruising of the left arm after our last procedure, where there was a venous access/sheath placed. His abdominal wall muscle pain has resolved.     Past Medical History:  Diagnosis Date  . Complication of anesthesia    woke up during coloscopy with mod sedation, needs to lay on left side hx intestinal blockage  . Crohn disease ([place])   . History of kidney stones yrs ago  . IBS (irritable bowel syndrome)   . Intestinal cramps    hx of blockage   . Non Hodgkin's lymphoma ([place])    remission since 2-14    Past Surgical History:  Procedure Laterality Date  . BIOPSY  09/22/2018   Procedure: BIOPSY;  Surgeon: Ronald Lobo, MD;  Location: [facility];  Service: Endoscopy;;  . COLONOSCOPY WITH PROPOFOL N/A 09/22/2018   Procedure: COLONOSCOPY WITH PROPOFOL;  Surgeon: Ronald Lobo, MD;  Location: [facility];  Service: Endoscopy;  Laterality: N/A;  . EXPLORATORY LAPAROTOMY WITH ABDOMINAL MASS EXCISION  2003   Exploratory laparotomy with excisional biopsy of mesenteric lymph  . EYE SURGERY     straighten eyes out, laser sx left eye  . IR RADIOLOGIST EVAL & MGMT  11/16/2019  . IR RADIOLOGIST EVAL & MGMT  11/30/2019  . IR RADIOLOGIST EVAL & MGMT  12/19/2019  . IR RADIOLOGIST EVAL & MGMT  02/23/2020    Allergies: Dilaudid [hydromorphone hcl], Penicillins, and Latex  Medications: Prior to Admission medications   Not on File     No family history on file.  Social History   Socioeconomic History  . Marital status: Married    Spouse name: Not on file  . Number of children: Not on file  . Years of education: Not on file  . Highest education level: Not on file  Occupational History  . Not on file  Tobacco Use  . Smoking status: Never Smoker  . Smokeless tobacco:  Never Used  Vaping Use  . Vaping Use: Never used  Substance and Sexual Activity  . Alcohol use: Yes    Comment: rarely  . Drug use: No  . Sexual activity: Not on file  Other Topics Concern  . Not on file  Social History Narrative  . Not on file   Social Determinants of Health   Financial Resource Strain: Not on file  Food Insecurity: Not on file  Transportation Needs: Not on file  Physical Activity: Not on file  Stress: Not on file  Social Connections: Not on file      Review of Systems  Review of Systems: A 12 point ROS discussed and pertinent positives are indicated in the HPI above.  All other systems are negative.  Physical Exam No direct physical exam was performed (except for noted visual exam findings with Video Visits).    Vital Signs: There were no vitals taken for this visit.  Imaging: No results found.  Labs:  CBC: No results for input(s): WBC, HGB, HCT, PLT in the last 8760 hours.  COAGS: No results for input(s): INR, APTT in the last 8760 hours.  BMP: No results for input(s): NA, K, CL, CO2, GLUCOSE, BUN, CALCIUM, CREATININE, GFRNONAA, GFRAA in the last 8760 hours.  Invalid input(s): CMP  LIVER FUNCTION TESTS: No results for input(s): BILITOT, AST, ALT, ALKPHOS, PROT, ALBUMIN in the last 8760 hours.  TUMOR MARKERS: No results for input(s): AFPTM, CEA, CA199, CHROMGRNA in the last 8760 hours.  Assessment and Plan:  Eric Winters is a 50 year old male with persisting chylous ascites after pelvic lymphangiogram and targeted lipiodol sclerotherapy of mesenteric lymph system at the site of prior node resection. Marland Kitchen   His ascites is refractory to conservative management, and has persisted after our treatment.   I had another lengthy discussion with him today, regarding our prior procedure, the lymphatics, and specifically the role of small bowel lymphatics.   We further discussed the logistics of repeat lymphangiogram from the inguinal region,  and the advanced VIR techniques that could be employed here for possible embolization.  This would include attempt at thoracic duct access, externalization of the venous end of the wire, retrograde diagnostic lymphangiogram with balloon occlusion, and possible embolization Rolla Plate and/or BORALE, PMID: [id number]).    We talked about the modest success rate of such a procedure, but the over all low risk.    I reminded him that the odds are not in our favor in this scenario, as the leak is presumably from the efferent lymph network of the small bowel/ileum, which is hard to opacify with traditional lymphangiogram.     Generally speaking, the success rates for healing a lymphatic leak such as this are:  - lymphangiogram with lipiodol:  ~30-50% - lymphangiogram with targeted needle fenestration: up to ~70% - lymphangiogram with ID of the leak site and embolization: up to 90%  These are probably optimistic.  We also discussed the risk profile, which is fairly benign, with risks of bleeding, infection, local injury, need for hospitalization, worsening of diarrhea, swelling, anesthesia risk, allergic reaction, pulmonary embolus, cardiopulmonary collapse, death.  Generally, this procedure is considered quite safe with overall lack of complications.   After our discussion, he again would like to attempt treatment with lymphangiogram and possible embolization.   Plan: - Plan for repeat lymphangiogram and possible embolization at [facility], with Dr. Earleen Newport (lipiodol, NBCA/glue).  Anticipate 3-4 hours with General Anesthesia - Continue current care     Electronically Signed: Corrie Mckusick 04/30/2020, 8:50 AM   I spent a total of    25 Minutes in remote  clinical consultation, greater than 50% of which was counseling/coordinating care for chylous ascites, possible lymphangiogram/embolization.    Visit type: Audio only (telephone). Audio (no video) only due to patient's lack of internet/smartphone  capability. Alternative for in-person consultation at [facility], [address], [place], [place]. This visit type was conducted due to national recommendations for restrictions regarding the COVID-19 Pandemic (e.g. social distancing).  This format is felt to be most appropriate for this patient at this time.  All issues noted in this document were discussed and addressed.

## 2020-06-24 ENCOUNTER — Other Ambulatory Visit: Payer: Self-pay | Admitting: Interventional Radiology

## 2020-06-24 DIAGNOSIS — I898 Other specified noninfective disorders of lymphatic vessels and lymph nodes: Secondary | ICD-10-CM

## 2020-07-19 ENCOUNTER — Other Ambulatory Visit: Payer: Self-pay

## 2020-07-19 ENCOUNTER — Ambulatory Visit
Admission: RE | Admit: 2020-07-19 | Discharge: 2020-07-19 | Disposition: A | Discharge status: Home / Self Care | Payer: Commercial Managed Care - PPO | Source: Ambulatory Visit | Attending: Interventional Radiology | Admitting: Interventional Radiology

## 2020-07-19 ENCOUNTER — Encounter: Payer: Self-pay | Admitting: *Deleted

## 2020-07-19 DIAGNOSIS — I898 Other specified noninfective disorders of lymphatic vessels and lymph nodes: Secondary | ICD-10-CM

## 2020-07-19 HISTORY — PX: IR RADIOLOGIST EVAL & MGMT: IMG5224

## 2020-07-19 NOTE — Progress Notes (Signed)
Chief Complaint: Chylous Ascites   Referring Physician(s): Dr. Cruzita Lederer, [facility]   History of Present Illness: Eric Winters is a 50 y.o. 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     I spoke with Eric Winters today with his wife, at his most recent paracentesis appointment.    Previous history.    HPI:   He had diagnosis of lymphoma in early 2000, in 2003 (open surgical biopsy of mesenteric lymph node), and active surveillance strategy was employed until about 2014.  In 2014 he had progression, and therapy was initiated.  This was ~6 cycles of chemotherapy, and then observation.  He was doing well until last summer, when he noticed some abdominal distension.  He came back to Dr. Cruzita Lederer for evaluation and PET was performed 10/04/19, confirming lymphoma.  Treatment was initiated.     He developed ascites, with initiation of large volume paracentesis  first 10/13/19.  He has required at least weekly LVP.  Fluid was positive for triglycerides, greater than 700, confirming chylous ascites.   Regarding his risk factors for chylous ascites, he is not cirrhotic.  He had a mesenteric biopsy performed in the [facility] system by Dr. Margot Chimes 09/02/2001, at time of his initial diagnosis.  He has had mesenteric involvement of lymphoma, with his CT scans showing extensive mesenteric lymphadenopathy.  No history of renal, bowel, liver, pancreatic surgery.      On 03/26/20 at [facility], we performed bilateral inguinal lymph node US guided access for pelvic lymphangiogram, fluoro-guided direct abdominal node access (at site of prior mesenteric node biopsy), and lipiodol sclerosis of the lymph system at the site of prior abdominal node biopsy (identified by surgery clips).   He continued LVP's after this initial attempt, with  volume 3-5L each para.  The fluid has been described as more clear.    Given his persisting symptoms, we planned for a second attempt at lymphangiogram and possible balloon occlusion retrograde abdominal lymphangiogram, possible embolization (BORAL/BORALE)  Interval History: Our second attempt for lymphangiogram and possible BORAL/BORALE was 06/13/2020.  Trans-abdominal cannulation of the thoracic duct failed.  We also attempted a direct flouro-guided puncture of the duct at the left neck, which failed.    He tells me that he recovered just fine from our procedure on 06/13/20. He did let me know that he feels the foley catheter that was placed during GETA was removed a bit abruptly in the PACU by anesthesia team, and that he was urinating blood for a day or so.  Other than that he recovered fine.    He continues to require LVP's, having had 8 performed since 5/5, with volumes ranging from 2.8L to ~5L.    He continues with his low fat diet, however, admittedly, he tells me that he takes more liberties with food choices these days because he is tired of the diet and he was losing significant weight.   He has appointment in [place] with physician to discuss the need for Octreotide treatment.    Past Medical History:  Diagnosis Date   Complication of anesthesia    woke up during coloscopy with mod sedation, needs to lay on left side hx intestinal blockage   Crohn disease ([place])    History of kidney stones yrs ago   IBS (irritable bowel syndrome)    Intestinal cramps  hx of blockage    Non Hodgkin's lymphoma ([place])    remission since 2-14    Past Surgical History:  Procedure Laterality Date   BIOPSY  09/22/2018   Procedure: BIOPSY;  Surgeon: Ronald Lobo, MD;  Location: [facility];  Service: Endoscopy;;   COLONOSCOPY WITH PROPOFOL N/A 09/22/2018   Procedure: COLONOSCOPY WITH PROPOFOL;  Surgeon: Ronald Lobo, MD;  Location: [facility];  Service: Endoscopy;  Laterality: N/A;    EXPLORATORY LAPAROTOMY WITH ABDOMINAL MASS EXCISION  2003   Exploratory laparotomy with excisional biopsy of mesenteric lymph   EYE SURGERY     straighten eyes out, laser sx left eye   IR RADIOLOGIST EVAL & MGMT  11/16/2019   IR RADIOLOGIST EVAL & MGMT  11/30/2019   IR RADIOLOGIST EVAL & MGMT  12/19/2019   IR RADIOLOGIST EVAL & MGMT  02/23/2020   IR RADIOLOGIST EVAL & MGMT  04/30/2020   IR RADIOLOGIST EVAL & MGMT  07/19/2020    Allergies: Dilaudid [hydromorphone hcl], Penicillins, and Latex  Medications: Prior to Admission medications   Not on File     No family history on file.  Social History   Socioeconomic History   Marital status: Married    Spouse name: Not on file   Number of children: Not on file   Years of education: Not on file   Highest education level: Not on file  Occupational History   Not on file  Tobacco Use   Smoking status: Never   Smokeless tobacco: Never  Vaping Use   Vaping Use: Never used  Substance and Sexual Activity   Alcohol use: Yes    Comment: rarely   Drug use: No   Sexual activity: Not on file  Other Topics Concern   Not on file  Social History Narrative   Not on file   Social Determinants of Health   Financial Resource Strain: Not on file  Food Insecurity: Not on file  Transportation Needs: Not on file  Physical Activity: Not on file  Stress: Not on file  Social Connections: Not on file     Review of Systems: A 12 point ROS discussed and pertinent positives are indicated in the HPI above.  All other systems are negative.  Review of Systems  Vital Signs: There were no vitals taken for this visit.  Physical Exam  Mallampati Score:     Imaging: IR Radiologist Eval & Mgmt  Result Date: 07/19/2020 Please refer to notes tab for details about interventional procedure. (Op Note)   Labs:  CBC: No results for input(s): WBC, HGB, HCT, PLT in the last 8760 hours.  COAGS: No results for input(s): INR, APTT in the last 8760  hours.  BMP: No results for input(s): NA, K, CL, CO2, GLUCOSE, BUN, CALCIUM, CREATININE, GFRNONAA, GFRAA in the last 8760 hours.  Invalid input(s): CMP  LIVER FUNCTION TESTS: No results for input(s): BILITOT, AST, ALT, ALKPHOS, PROT, ALBUMIN in the last 8760 hours.  TUMOR MARKERS: No results for input(s): AFPTM, CEA, CA199, CHROMGRNA in the last 8760 hours.  Assessment and Plan:  Eric Moll is 50 year old male with ongoing chylous ascites, secondary to abdominal lymphoma, which is now in remission.    Our 2 prior attempts for lymphangiogram and possible BORAL/BORALE were not successful.    I did discuss with him the possibility of referral to a tertiary/quaternary academic center for further conversation and possible treatment.  He is willing to do this.    I have spoken with  contacts at [facility], and we will refer to Associate Professor Dr. Anson Fret of the Vascular & Interventional Service.   [place]: [phone].  Referral from Dr. Corrie Mckusick  He appreciates our help, and understands the plan.    Plan: - referral to [facility] clinic, as above.  My office will make referral and can provide my personal contact number to the [place] office if Dr. Maudie Mercury would like to call.       Electronically Signed: Corrie Mckusick 07/19/2020, 4:51 PM   I spent a total of    15 Minutes in face to face in clinical consultation, greater than 50% of which was counseling/coordinating care for chylous ascites, prior lymphangiogram

## 2020-07-24 ENCOUNTER — Telehealth: Payer: Commercial Managed Care - PPO

## 2024-02-08 ENCOUNTER — Other Ambulatory Visit: Payer: Self-pay | Admitting: Student

## 2024-02-08 DIAGNOSIS — R1011 Right upper quadrant pain: Secondary | ICD-10-CM

## 2024-02-23 ENCOUNTER — Ambulatory Visit
Admission: RE | Admit: 2024-02-23 | Discharge: 2024-02-23 | Disposition: A | Discharge status: Home / Self Care | Source: Ambulatory Visit | Attending: Student | Admitting: Student

## 2024-02-23 DIAGNOSIS — R1011 Right upper quadrant pain: Secondary | ICD-10-CM
